# Patient Record
Sex: Male | Born: 1955 | Race: White | Hispanic: No | Marital: Married | Smoking: Never smoker
Health system: Southern US, Community
[De-identification: ages and names within clinical notes are randomized; demographics above are authoritative.]

## PROBLEM LIST (undated history)

## (undated) DIAGNOSIS — C4490 Unspecified malignant neoplasm of skin, unspecified: Secondary | ICD-10-CM

## (undated) DIAGNOSIS — Z7984 Long term (current) use of oral hypoglycemic drugs: Secondary | ICD-10-CM

## (undated) DIAGNOSIS — R945 Abnormal results of liver function studies: Secondary | ICD-10-CM

## (undated) DIAGNOSIS — D473 Essential (hemorrhagic) thrombocythemia: Principal | ICD-10-CM

## (undated) DIAGNOSIS — E119 Type 2 diabetes mellitus without complications: Secondary | ICD-10-CM

## (undated) DIAGNOSIS — M109 Gout, unspecified: Secondary | ICD-10-CM

## (undated) HISTORY — DX: Long term (current) use of oral hypoglycemic drugs: Z79.84

## (undated) HISTORY — DX: Gout, unspecified: M10.9

## (undated) HISTORY — DX: Abnormal results of liver function studies: R94.5

## (undated) HISTORY — DX: Unspecified malignant neoplasm of skin, unspecified: C44.90

## (undated) HISTORY — DX: Type 2 diabetes mellitus without complications: E11.9

## (undated) HISTORY — DX: Essential (hemorrhagic) thrombocythemia: D47.3

---

## 1999-11-27 ENCOUNTER — Ambulatory Visit (HOSPITAL_COMMUNITY): Admission: RE | Admit: 1999-11-27 | Discharge: 1999-11-27 | Payer: Self-pay | Admitting: Family Medicine

## 1999-11-27 ENCOUNTER — Encounter: Payer: Self-pay | Admitting: Family Medicine

## 2009-08-16 ENCOUNTER — Ambulatory Visit (HOSPITAL_BASED_OUTPATIENT_CLINIC_OR_DEPARTMENT_OTHER): Admission: RE | Admit: 2009-08-16 | Discharge: 2009-08-16 | Payer: Self-pay | Admitting: Orthopedic Surgery

## 2011-03-20 LAB — POCT HEMOGLOBIN-HEMACUE: Hemoglobin: 15.6 g/dL (ref 13.0–17.0)

## 2014-12-04 ENCOUNTER — Ambulatory Visit (HOSPITAL_BASED_OUTPATIENT_CLINIC_OR_DEPARTMENT_OTHER): Payer: BC Managed Care – PPO | Admitting: Genetic Counselor

## 2014-12-04 ENCOUNTER — Encounter: Payer: Self-pay | Admitting: Genetic Counselor

## 2014-12-04 ENCOUNTER — Ambulatory Visit: Payer: BC Managed Care – PPO

## 2014-12-04 DIAGNOSIS — Z315 Encounter for genetic counseling: Secondary | ICD-10-CM

## 2014-12-04 DIAGNOSIS — C439 Malignant melanoma of skin, unspecified: Secondary | ICD-10-CM

## 2014-12-04 DIAGNOSIS — Z808 Family history of malignant neoplasm of other organs or systems: Secondary | ICD-10-CM | POA: Insufficient documentation

## 2014-12-04 DIAGNOSIS — Z800 Family history of malignant neoplasm of digestive organs: Secondary | ICD-10-CM

## 2014-12-04 DIAGNOSIS — Z803 Family history of malignant neoplasm of breast: Secondary | ICD-10-CM

## 2014-12-04 NOTE — Progress Notes (Signed)
REFERRING PROVIDER: Crista Luria, MD [address] [place], [place] [address]  PRIMARY PROVIDER:  Tivis Ringer, MD  PRIMARY REASON FOR VISIT:  1. Melanoma of skin   2. Family history of breast cancer   3. Family history of pancreatic cancer   4. Family history of melanoma      HISTORY OF PRESENT ILLNESS:   Victor Nichols, a 58 y.o. male, was seen for a [facility] cancer genetics consultation at the request of Dr. Crista Nichols due to a personal and family history of cancer.  Victor Nichols presents to clinic today to discuss the possibility of a hereditary predisposition to cancer, genetic testing, and to further clarify his future cancer risks, as well as potential cancer risks for family members.   CANCER HISTORY:   No history exists.     HISTORY OF PRESENT ILLNESS: In 1999, at the age of 33, Victor Nichols was diagnosed with malignant melanom of the left zygoma. This was treated with surgical incision.  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  Victor Nichols grew up in [place], and feels that his melanoma is due to sun exposure.    RISK FACTORS:  Smoker: no Alcohol: yes Diabetes: yes Sun Screen use: yes Skin exam: every 3-6 months.  Past Medical History  Diagnosis Date  . Skin cancer 1999/2007/2015    malignant melanoma x2; melanoma in situ x1    History reviewed. No pertinent past surgical history.  History   Social History  . Marital Status: Married    Spouse Name: N/A    Number of Children: 3  . Years of Education: N/A   Social History Main Topics  . Smoking status: Never Smoker   . Smokeless tobacco: None  . Alcohol Use: Yes  . Drug Use: No  . Sexual Activity: None   Other Topics Concern  . None   Social History Narrative  . None     FAMILY HISTORY:  We obtained a detailed, 4-generation family history.  Significant diagnoses are listed below: Family History   Problem Relation Age of Onset  . Melanoma Father 42  . Breast cancer Paternal Aunt 47  . Pancreatic cancer Paternal Grandmother 16  . Cancer Cousin 33    tonsilar cancer   Victor Nichols has two sisters who has not had any form of cancer.  His father was recently diagnosed with melanoma on his ear, and his mother has had multiple BCC/SCC.  Victor Nichols paternal aunt had breast cancer at age 50, her son had tonsilar cancer at 64, and his paternal grandmother had pancreatic cancer.  Patient's maternal and paternal ancestors are of [place] descent. There is reported Ashkenazi Jewish ancestry. There is no known consanguinity.  GENETIC COUNSELING ASSESSMENT: MALE MINISH is a 58 y.o. male with a personal history of melanoma (3 diagnosis) and a family history of breast and pancreatic cancer and melanoma which somewhat suggestive of a CDKN2A/CDK4 mutation, or possibly a BRCA mutation based on the cancer history and Jewish descent and therefore a predisposition to cancer. We, therefore, discussed and recommended the following at today's visit.   DISCUSSION: We reviewed the characteristics, features and inheritance patterns of hereditary cancer syndromes. Approximately 10% of melanoma cases are hereditary.  Most cases of hereditary melanoma, especially in light of a family history of pancreatic cancer, is due to CDKN2A/CDK4 mutations.  There are other hereditary conditions  associated with the cancers in his family as well, most specifically BRCA mutations.  We reviewed BRCA mutations in light of his family history and ancestry.  Other genes that can increase the risk for hereditary melanoma include: BAP1, PTEN, TP53, MITF, RB1 and BRCA2.  There are three genes that have limited evidence to melanoma increased risk which include BRCA1, MC1R, and TERT.   We also discussed genetic testing, including the appropriate family members to test, the process of testing, insurance coverage and turn-around-time for results.  We discussed the implications of a negative, positive and/or variant of uncertain significant result. In order to test for risks based on his Ashkenazi Jewish ancestry as well as his personal and family history, we recommended Victor Nichols pursue genetic testing for the Melanoma gene panel. The [facility] melanoma gene panel consists of the following genes:  BAP1, BRCA2 CDKN2A, CDK4, PTEN, MITF, RB1  And TP53 and the three limited evidence genes including BRCA1, MC1R, and TERT.  PLAN: After considering the risks, benefits, and limitations,Victor Nichols  provided informed consent to pursue genetic testing and the blood sample was sent to [facility] for analysis of the Melanoma gene panel. Results should be available within approximately 3 weeks' time, at which point they will be disclosed by telephone to Victor Nichols, as will any additional recommendations warranted by these results. Victor Nichols will receive a summary of his genetic counseling visit and a copy of his results once available. This information will also be available in Epic. We encouraged Victor Nichols to remain in contact with cancer genetics annually so that we can continuously update the family history and inform him of any changes in cancer genetics and testing that may be of benefit for his family. Victor Nichols questions were answered to his satisfaction today. Our contact information was provided should additional questions or concerns arise.  Lastly, we encouraged Victor Nichols to remain in contact with cancer genetics annually so that we can continuously update the family history and inform him of any changes in cancer genetics and testing that may be of benefit for this family.   Mr.  Nichols questions were answered to his satisfaction today. Our contact information was provided should additional questions or concerns arise. Thank you for the referral and allowing [place] to share in the care of your patient.   Victor Nichols P. Victor Nichols, [place], [facility] Certified Genetic  Counselor [email or web address] phone: [phone]  The patient was seen for a total of 45 minutes in face-to-face genetic counseling.  This patient was discussed with Drs. Magrinat, Lindi Adie and/or Burr Medico who agrees with the above.    _______________________________________________________________________ For Office Staff:  Number of people involved in session: 1 Was an Intern/ student involved with case: no

## 2014-12-25 ENCOUNTER — Encounter: Payer: Self-pay | Admitting: Genetic Counselor

## 2014-12-25 ENCOUNTER — Telehealth: Payer: Self-pay | Admitting: Genetic Counselor

## 2014-12-25 DIAGNOSIS — Z1379 Encounter for other screening for genetic and chromosomal anomalies: Secondary | ICD-10-CM | POA: Insufficient documentation

## 2014-12-25 NOTE — Progress Notes (Signed)
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 Please refer to our prior cancer genetics clinic note for more information regarding Dr. Everlene Other medical, social and family histories, and our assessment and recommendations, at the time. Dr. Everlene Other recent genetic test results were disclosed to him, as were recommendations warranted by these results. These results and recommendations are discussed in more detail below.  GENETIC TEST RESULTS: At the time of Dr. Everlene Other visit, we recommended he pursue genetic testing of the Melanoma gene panel. The melanoma panel performs sequencing and deletion and duplication testing of the following 11 genes:  BAP1, BRA1, BRCA2, CDK4, CDKN2A, MC1R, PTEN, RB1, TERT, and TP53.  Sequencing only was performed on MITF (c.952G>A variant only).  Report date is 12/24/2014. Testing was performed at [facility]. 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 The test report has been scanned into EPIC and is located under the Media tab. Dr. Everlene Other results were released to him via the [facility] patient portal.  Based on Dr. Everlene Other Ashkenazi Jewish heritage, we had discussed the increased risk for testing positive for a founder mutation within Datto or BRCA2.  Both of these genes were tested through sequencing and deletion and duplication analysis and were negative for any deleterious mutations.   We discussed with Dr. Conrad Byram that since the current genetic testing is not perfect, it is possible there may be a gene mutation in one of these genes that current testing cannot detect, but that chance is small. We also discussed, that it is possible that another gene that has not yet been discovered, or that we have not yet tested, is responsible for the cancer diagnoses in  the family, and it is, therefore, important to remain in touch with cancer genetics in the future so that we can continue to offer Dr. Conrad Hiram the most up to date genetic testing.   CANCER SCREENING RECOMMENDATIONS: This result is reassuring and suggests that Dr. Everlene Other cancer was most likely not due to an inherited predisposition associated with one of these genes. Most cancers happen by chance and this negative test, along with details of his family history, suggests that his cancer falls into this category. We, therefore, recommended he continue to follow the cancer management and screening guidelines provided by his oncology and primary providers.   RECOMMENDATIONS FOR FAMILY MEMBERS: Women in this family might be at some increased risk of developing cancer, over the general population risk, simply due to the family history of cancer. We recommended women in this family have a yearly mammogram beginning at age 58, an an annual clinical breast exam, and perform monthly breast self-exams. Women in this family should also have a gynecological exam as recommended by their primary provider. All family members should have a colonoscopy by age 58.  FOLLOW-UP: Lastly, we discussed with Mr. Baney that cancer genetics is a rapidly advancing field and it is possible that new genetic tests will be appropriate for him and/or his family members in the future. We encouraged him to remain in contact with cancer genetics on an annual basis so we can update his personal and family histories and let him know of advances in cancer genetics that may benefit this family.   Our contact number was provided. Mr.. Kopera questions were answered to his satisfaction, and she  knows he is welcome to call us at anytime with additional questions or concerns.   Roma Kayser, MS, [facility] Certified Genetic Counselor [email or web address]

## 2014-12-25 NOTE — Telephone Encounter (Signed)
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  Also discussed that based on his [place] heritage, we were concerned about a possible founder BRCA1 or BRCA2 deleterious mutation.  No deleterious mutations were found in any genes tested.  The melanoma panel performs sequencing and deletion and duplication testing of the following 11 genes:  BAP1, BRA1, BRCA2, CDK4, CDKN2A, MC1R, PTEN, RB1, TERT, and TP53.  Sequencing only was performed on MITF (c.952G>A variant only).  Report date is 12/24/2014.

## 2016-03-20 ENCOUNTER — Telehealth: Payer: Self-pay | Admitting: Genetic Counselor

## 2016-03-20 NOTE — Telephone Encounter (Signed)
Spoke with wife.  She will contact Dr. Conrad Fithian and have him call me.  Left my phone number.

## 2016-03-20 NOTE — Telephone Encounter (Signed)
Revealed that MC1R VUS previously found on Melanoma cancer panel has been reclassified as a likely benign change. The test is now considered completely negative. Patient voiced his understanding.

## 2016-06-08 DIAGNOSIS — L720 Epidermal cyst: Secondary | ICD-10-CM | POA: Diagnosis not present

## 2016-06-08 DIAGNOSIS — L821 Other seborrheic keratosis: Secondary | ICD-10-CM | POA: Diagnosis not present

## 2016-06-08 DIAGNOSIS — Z85828 Personal history of other malignant neoplasm of skin: Secondary | ICD-10-CM | POA: Diagnosis not present

## 2016-06-08 DIAGNOSIS — Z85820 Personal history of malignant melanoma of skin: Secondary | ICD-10-CM | POA: Diagnosis not present

## 2016-08-05 DIAGNOSIS — D225 Melanocytic nevi of trunk: Secondary | ICD-10-CM | POA: Diagnosis not present

## 2016-08-05 DIAGNOSIS — Z85828 Personal history of other malignant neoplasm of skin: Secondary | ICD-10-CM | POA: Diagnosis not present

## 2016-08-05 DIAGNOSIS — Z85820 Personal history of malignant melanoma of skin: Secondary | ICD-10-CM | POA: Diagnosis not present

## 2016-08-05 DIAGNOSIS — Z87898 Personal history of other specified conditions: Secondary | ICD-10-CM | POA: Diagnosis not present

## 2016-09-21 DIAGNOSIS — Z0000 Encounter for general adult medical examination without abnormal findings: Secondary | ICD-10-CM | POA: Diagnosis not present

## 2016-09-21 DIAGNOSIS — E119 Type 2 diabetes mellitus without complications: Secondary | ICD-10-CM | POA: Diagnosis not present

## 2016-09-21 DIAGNOSIS — M109 Gout, unspecified: Secondary | ICD-10-CM | POA: Diagnosis not present

## 2016-09-21 DIAGNOSIS — Z125 Encounter for screening for malignant neoplasm of prostate: Secondary | ICD-10-CM | POA: Diagnosis not present

## 2016-09-29 DIAGNOSIS — E119 Type 2 diabetes mellitus without complications: Secondary | ICD-10-CM | POA: Diagnosis not present

## 2016-09-29 DIAGNOSIS — Z23 Encounter for immunization: Secondary | ICD-10-CM | POA: Diagnosis not present

## 2016-09-29 DIAGNOSIS — M109 Gout, unspecified: Secondary | ICD-10-CM | POA: Diagnosis not present

## 2016-09-29 DIAGNOSIS — C439 Malignant melanoma of skin, unspecified: Secondary | ICD-10-CM | POA: Diagnosis not present

## 2016-09-29 DIAGNOSIS — D72829 Elevated white blood cell count, unspecified: Secondary | ICD-10-CM | POA: Diagnosis not present

## 2016-09-29 DIAGNOSIS — Z1389 Encounter for screening for other disorder: Secondary | ICD-10-CM | POA: Diagnosis not present

## 2017-04-14 ENCOUNTER — Ambulatory Visit: Payer: Self-pay

## 2017-05-11 ENCOUNTER — Encounter: Payer: Self-pay | Admitting: Oncology

## 2017-05-11 ENCOUNTER — Ambulatory Visit (INDEPENDENT_AMBULATORY_CARE_PROVIDER_SITE_OTHER): Payer: 59 | Admitting: Oncology

## 2017-05-11 VITALS — BP 135/92 | HR 76 | Temp 97.6°F | Ht 72.0 in | Wt 220.7 lb

## 2017-05-11 DIAGNOSIS — Z800 Family history of malignant neoplasm of digestive organs: Secondary | ICD-10-CM

## 2017-05-11 DIAGNOSIS — Z832 Family history of diseases of the blood and blood-forming organs and certain disorders involving the immune mechanism: Secondary | ICD-10-CM | POA: Diagnosis not present

## 2017-05-11 DIAGNOSIS — Z888 Allergy status to other drugs, medicaments and biological substances status: Secondary | ICD-10-CM

## 2017-05-11 DIAGNOSIS — D473 Essential (hemorrhagic) thrombocythemia: Secondary | ICD-10-CM

## 2017-05-11 DIAGNOSIS — Z803 Family history of malignant neoplasm of breast: Secondary | ICD-10-CM

## 2017-05-11 DIAGNOSIS — Z7982 Long term (current) use of aspirin: Secondary | ICD-10-CM

## 2017-05-11 DIAGNOSIS — R7989 Other specified abnormal findings of blood chemistry: Secondary | ICD-10-CM

## 2017-05-11 DIAGNOSIS — Z7984 Long term (current) use of oral hypoglycemic drugs: Secondary | ICD-10-CM

## 2017-05-11 DIAGNOSIS — Z808 Family history of malignant neoplasm of other organs or systems: Secondary | ICD-10-CM

## 2017-05-11 DIAGNOSIS — Z8269 Family history of other diseases of the musculoskeletal system and connective tissue: Secondary | ICD-10-CM | POA: Diagnosis not present

## 2017-05-11 DIAGNOSIS — R945 Abnormal results of liver function studies: Secondary | ICD-10-CM

## 2017-05-11 DIAGNOSIS — E119 Type 2 diabetes mellitus without complications: Secondary | ICD-10-CM

## 2017-05-11 DIAGNOSIS — M109 Gout, unspecified: Secondary | ICD-10-CM

## 2017-05-11 DIAGNOSIS — R7303 Prediabetes: Secondary | ICD-10-CM | POA: Diagnosis not present

## 2017-05-11 HISTORY — DX: Gout, unspecified: M10.9

## 2017-05-11 HISTORY — DX: Type 2 diabetes mellitus without complications: E11.9

## 2017-05-11 HISTORY — DX: Other specified abnormal findings of blood chemistry: R79.89

## 2017-05-11 HISTORY — DX: Essential (hemorrhagic) thrombocythemia: D47.3

## 2017-05-11 HISTORY — DX: Abnormal results of liver function studies: R94.5

## 2017-05-11 LAB — SAVE SMEAR

## 2017-05-11 MED ORDER — HYDROXYUREA 500 MG PO CAPS: 500.0000 mg | ORAL_CAPSULE | Freq: Every day | ORAL | 6 refills | Status: DC

## 2017-05-11 NOTE — Patient Instructions (Addendum)
Start Hydrea 500 mg one daily  Lab every 2 weeks to start Friday 6/15 Return visit 8-10 weeks We will schedule an ultrasound of your abdomen & someone will call with an appointment

## 2017-05-11 NOTE — Progress Notes (Signed)
New Patient Hematology   NEWMAN WAREN [id number] Mar 17, 1956 61 y.o. 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  This was initially felt to be secondary to an acute gout flare.  However, once gout was under control platelet count remained elevated.  Count in the range of 800,000.  I asked his primary care physician to obtain a JAK-2 gene analysis and in fact he did have a mutation in this gene.  He had no neurologic signs or symptoms.  No prior history of thrombosis.  I recommended aspirin 81 mg daily.  We had a number of phone discussions informally.  He is here today for a more formal evaluation as he turns 61 years old next month. He has no major risk factors for vascular disease.  He is normotensive.  No hyperlipidemia.  Non-smoker.  He was found to be prediabetic about 4 years ago and is currently on metformin. Of interest is the fact that his mother who is in her 67s has been diagnosed with polycythemia vera. JAK-2 status unknown.  Currently on Hydrea.  She has also developed temporal arteritis.  A 63 year old sister has an elevated platelet count.  Further evaluation not known to the patient.  Another sister age 66 with no known blood problems.   PMH: Past Medical History:  Diagnosis Date  . Abnormal liver function test 05/11/2017   Mild, chronic, transaminase elevations; liver edge palpable on exam  . 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 9/17  Platelets 800,000  . Gouty arthritis 05/11/2017  . 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 No history of ulcers, hypertension, hepatitis, yellow jaundice, malaria, asthma, tuberculosis, inflammatory arthritis other than gout.  No  history of seizure, stroke, thyroid disease, prostate trouble.   surgical history: Negative except excision of melanoma and basal cell carcinomas  Allergies: Allergies  Allergen Reactions  . Allopurinol Rash    Medications:  Current Outpatient Prescriptions:  .  hydroxyurea (HYDREA) 500 MG capsule, Take 1 capsule (500 mg total) by mouth daily. May take with food to minimize GI side effects., Disp: 30 capsule, Rfl: 6 to start on May 17, 2017. Uloric 40 mg daily Glucophage 500 mg daily?  Social History: Anesthesiologist.  Married.  Originally from [place].  Son age 13, son age 64, daughter age 77, all healthy.  reports that he has never smoked. He has never used smokeless tobacco. He reports that he drinks alcohol. He reports that he does not use drugs.  Family History: Family History  Problem Relation Age of Onset  . Melanoma Father 34  . Breast cancer Paternal Aunt 31  . Pancreatic cancer Paternal Grandmother 49  . Cancer Cousin 70       tonsilar cancer  See HPI  Review of Systems: No headache, double vision, blurry vision, slurred speech, focal weakness, or paresthesias. No chest pain, dyspnea, chest pressure, palpitations. No abdominal pain, no change in bowel habit, chronic, mild, elevation of transaminase enzymes felt possibly secondary to chronic exposure to anesthetics. No urinary tract symptoms Remaining ROS negative.  Physical Exam: Blood pressure (!) 135/92, pulse 76, temperature 97.6 F (36.4 C), temperature source Oral, height 6' (1.829 m), weight 220 lb 11.2 oz (100.1  kg), SpO2 100 %. Wt Readings from Last 3 Encounters:  05/11/17 220 lb 11.2 oz (100.1 kg)     General appearance: Well-nourished Caucasian man HENNT: Pharynx no erythema, exudate, mass, or ulcer. No thyromegaly or thyroid nodules Lymph nodes: No cervical, supraclavicular, or axillary lymphadenopathy Breasts:  Lungs: Clear to auscultation, resonant to percussion throughout Heart: Regular  rhythm, no murmur, no gallop, no rub, no click, no edema Abdomen: Soft, nontender, normal bowel sounds, no mass, liver palpable 3-4 cm below right costal margin.  Smooth edge.  No definite splenomegaly. Extremities: No edema, no calf tenderness Musculoskeletal: no joint deformities GU:  Vascular: Carotid pulses 2+, no bruits, distal pulses: Dorsalis pedis 2+ symmetric Neurologic: Alert, oriented, PERRLA, optic discs sharp and vessels normal, no hemorrhage or exudate, cranial nerves grossly normal, motor strength 5 over 5, reflexes 1+ symmetric, upper body coordination normal, gait normal, Skin: No rash or ecchymosis    Lab Results: Lab Results  Component Value Date   HGB 15.6 08/16/2009     Chemistry   No results found for: NA, K, CL, CO2, BUN, CREATININE, GLU No results found for: CALCIUM, ALKPHOS, AST, ALT, BILITOT     Review of peripheral blood film:   Impression: Essential thrombocythemia  We had a open discussion about diagnosis, prognosis, and available treatment options.  We reviewed the fact that ET is a stem cell disorder.  It portends an increased risk for both venous and arterial thrombosis.  Prophylactic aspirin has been the mainstay to prevent thrombosis in individuals diagnosed with non-CML myeloproliferative disorders.  No one has ever done a high quality prospective study relating absolute platelet number with thrombosis.  Thrombosis is most likely related to platelet dysfunction and not absolute number and this can occur at any number.  Traditional risk factors for thrombosis have been history of any prior thrombosis, male sex, and up until recently, age 5 or above.  A recent large cohort study looking at risk for arterial and venous thrombosis in patients with myeloproliferative neoplasms published in the annals of internal medicine February 16, 2017, 168:317 by Kristie Cowman al, found that thrombotic risk was independent of age.  Many of the thrombotic events tend to occur  as sign of initial disease or within 1 month of diagnosis. It also appears that the JAK-2 mutation carries a higher risk of thrombosis than the other major mutations calreticulin and mpl.  It is suggested that a more comprehensive risk assessment including these observations and genetic data will better inform treatment decisions better than traditional parameters of age and sex.  This study did not address risk related to absolute platelet numbers but did cite a number of references that have demonstrated that the absolute platelet count has not been well correlated with increased thrombotic risk.     Recommendation: He is already on aspirin.  No prior thrombotic events.  He will turn 61 next month.  He was found to have a JAK-2 mutation which may predict a higher risk for thrombosis. Some international experts including Dr.Tifferi at the [facility], would recommend starting him on platelet lowering therapy.  His mother is already on Hydrea and he is encouraged that she has tolerated this very well.  He is not opposed to a trial of Hydrea and we decided to start him on 500 mg daily.  Monitor blood counts every 2 weeks until steady state with target platelet count of 400,000 or below realizing that this is totally arbitrary.    Murriel Hopper, MD,  FACP  Hematology-Oncology/Internal Medicine  05/11/2017, 5:11 PM

## 2017-05-12 LAB — CBC WITH DIFFERENTIAL/PLATELET
Basophils Absolute: 0 10*3/uL (ref 0.0–0.2)
Basos: 0 %
EOS (ABSOLUTE): 0.3 10*3/uL (ref 0.0–0.4)
Eos: 2 %
Hematocrit: 40.7 % (ref 37.5–51.0)
Hemoglobin: 13.7 g/dL (ref 13.0–17.7)
IMMATURE GRANS (ABS): 0 10*3/uL (ref 0.0–0.1)
Immature Granulocytes: 0 %
LYMPHS ABS: 2.9 10*3/uL (ref 0.7–3.1)
LYMPHS: 21 %
MCH: 29.5 pg (ref 26.6–33.0)
MCHC: 33.7 g/dL (ref 31.5–35.7)
MCV: 88 fL (ref 79–97)
Monocytes Absolute: 1.1 10*3/uL — ABNORMAL HIGH (ref 0.1–0.9)
Monocytes: 8 %
NEUTROS ABS: 9.2 10*3/uL — AB (ref 1.4–7.0)
Neutrophils: 69 %
PLATELETS: 826 10*3/uL — AB (ref 150–379)
RBC: 4.65 x10E6/uL (ref 4.14–5.80)
RDW: 15.4 % (ref 12.3–15.4)
WBC: 13.6 10*3/uL — ABNORMAL HIGH (ref 3.4–10.8)

## 2017-05-13 ENCOUNTER — Ambulatory Visit (HOSPITAL_COMMUNITY)
Admission: RE | Admit: 2017-05-13 | Discharge: 2017-05-13 | Disposition: A | Discharge status: Home / Self Care | Payer: 59 | Source: Ambulatory Visit | Attending: Oncology | Admitting: Oncology

## 2017-05-13 DIAGNOSIS — K760 Fatty (change of) liver, not elsewhere classified: Secondary | ICD-10-CM | POA: Insufficient documentation

## 2017-05-13 DIAGNOSIS — N200 Calculus of kidney: Secondary | ICD-10-CM | POA: Diagnosis not present

## 2017-05-13 DIAGNOSIS — D696 Thrombocytopenia, unspecified: Secondary | ICD-10-CM | POA: Insufficient documentation

## 2017-05-13 DIAGNOSIS — R7989 Other specified abnormal findings of blood chemistry: Secondary | ICD-10-CM

## 2017-05-13 DIAGNOSIS — R945 Abnormal results of liver function studies: Secondary | ICD-10-CM | POA: Diagnosis present

## 2017-05-13 DIAGNOSIS — D473 Essential (hemorrhagic) thrombocythemia: Secondary | ICD-10-CM

## 2017-05-26 ENCOUNTER — Ambulatory Visit (HOSPITAL_COMMUNITY): Payer: 59

## 2017-05-28 ENCOUNTER — Other Ambulatory Visit: Payer: Self-pay | Admitting: Oncology

## 2017-05-28 ENCOUNTER — Other Ambulatory Visit (INDEPENDENT_AMBULATORY_CARE_PROVIDER_SITE_OTHER): Payer: 59

## 2017-05-28 DIAGNOSIS — M109 Gout, unspecified: Secondary | ICD-10-CM

## 2017-05-28 DIAGNOSIS — D473 Essential (hemorrhagic) thrombocythemia: Secondary | ICD-10-CM

## 2017-05-28 LAB — COMPREHENSIVE METABOLIC PANEL
ALBUMIN: 4.1 g/dL (ref 3.5–5.0)
ALK PHOS: 75 U/L (ref 38–126)
ALT: 44 U/L (ref 17–63)
ANION GAP: 8 (ref 5–15)
AST: 32 U/L (ref 15–41)
BUN: 18 mg/dL (ref 6–20)
CALCIUM: 9.7 mg/dL (ref 8.9–10.3)
CHLORIDE: 102 mmol/L (ref 101–111)
CO2: 27 mmol/L (ref 22–32)
Creatinine, Ser: 1.19 mg/dL (ref 0.61–1.24)
GFR calc Af Amer: >60 mL/min (ref >60)
GFR calc non Af Amer: >60 mL/min (ref >60)
GLUCOSE: 173 mg/dL — AB (ref 65–99)
POTASSIUM: 4.5 mmol/L (ref 3.5–5.1)
SODIUM: 137 mmol/L (ref 135–145)
Total Bilirubin: 0.5 mg/dL (ref 0.3–1.2)
Total Protein: 7.6 g/dL (ref 6.5–8.1)

## 2017-05-28 LAB — CBC WITH DIFFERENTIAL/PLATELET
BASOS PCT: 0 %
Basophils Absolute: 0 10*3/uL (ref 0.0–0.1)
EOS ABS: 0.2 10*3/uL (ref 0.0–0.7)
EOS PCT: 1 %
HCT: 44.1 % (ref 39.0–52.0)
HEMOGLOBIN: 14.5 g/dL (ref 13.0–17.0)
Lymphocytes Relative: 19 %
Lymphs Abs: 2.3 10*3/uL (ref 0.7–4.0)
MCH: 29.6 pg (ref 26.0–34.0)
MCHC: 32.9 g/dL (ref 30.0–36.0)
MCV: 90 fL (ref 78.0–100.0)
MONOS PCT: 6 %
Monocytes Absolute: 0.7 10*3/uL (ref 0.1–1.0)
NEUTROS PCT: 74 %
Neutro Abs: 8.8 10*3/uL — ABNORMAL HIGH (ref 1.7–7.7)
PLATELETS: 774 10*3/uL — AB (ref 150–400)
RBC: 4.9 MIL/uL (ref 4.22–5.81)
RDW: 15.5 % (ref 11.5–15.5)
WBC: 12 10*3/uL — AB (ref 4.0–10.5)

## 2017-05-28 LAB — URIC ACID: Uric Acid, Serum: 4.6 mg/dL (ref 4.4–7.6)

## 2017-05-28 LAB — LACTATE DEHYDROGENASE: LDH: 182 U/L (ref 98–192)

## 2017-05-28 MED ORDER — HYDROXYUREA 500 MG PO CAPS: ORAL_CAPSULE | ORAL | 6 refills | Status: DC

## 2017-05-28 NOTE — Addendum Note (Signed)
Addended by: Truddie Crumble on: 05/28/2017 10:18 AM   Modules accepted: Orders

## 2017-06-09 ENCOUNTER — Other Ambulatory Visit (INDEPENDENT_AMBULATORY_CARE_PROVIDER_SITE_OTHER): Payer: 59

## 2017-06-09 ENCOUNTER — Other Ambulatory Visit: Payer: Self-pay | Admitting: Oncology

## 2017-06-09 DIAGNOSIS — D473 Essential (hemorrhagic) thrombocythemia: Secondary | ICD-10-CM | POA: Diagnosis not present

## 2017-06-09 LAB — CBC WITH DIFFERENTIAL/PLATELET
BASOS ABS: 0 10*3/uL (ref 0.0–0.1)
BASOS PCT: 0 %
EOS ABS: 0.1 10*3/uL (ref 0.0–0.7)
Eosinophils Relative: 1 %
HCT: 41.5 % (ref 39.0–52.0)
HEMOGLOBIN: 13.9 g/dL (ref 13.0–17.0)
Lymphocytes Relative: 18 %
Lymphs Abs: 2.2 10*3/uL (ref 0.7–4.0)
MCH: 29.9 pg (ref 26.0–34.0)
MCHC: 33.5 g/dL (ref 30.0–36.0)
MCV: 89.2 fL (ref 78.0–100.0)
MONOS PCT: 8 %
Monocytes Absolute: 1 10*3/uL (ref 0.1–1.0)
NEUTROS PCT: 73 %
Neutro Abs: 9 10*3/uL — ABNORMAL HIGH (ref 1.7–7.7)
Platelets: 616 10*3/uL — ABNORMAL HIGH (ref 150–400)
RBC: 4.65 MIL/uL (ref 4.22–5.81)
RDW: 15.9 % — AB (ref 11.5–15.5)
WBC: 12.4 10*3/uL — ABNORMAL HIGH (ref 4.0–10.5)

## 2017-06-09 NOTE — Addendum Note (Signed)
Addended by: Truddie Crumble on: 06/09/2017 10:57 AM   Modules accepted: Orders

## 2017-06-09 NOTE — Addendum Note (Signed)
Addended by: Truddie Crumble on: 06/09/2017 10:40 AM   Modules accepted: Orders

## 2017-06-29 ENCOUNTER — Other Ambulatory Visit (INDEPENDENT_AMBULATORY_CARE_PROVIDER_SITE_OTHER): Payer: 59

## 2017-06-29 ENCOUNTER — Other Ambulatory Visit: Payer: Self-pay | Admitting: Oncology

## 2017-06-29 DIAGNOSIS — D473 Essential (hemorrhagic) thrombocythemia: Secondary | ICD-10-CM

## 2017-06-29 LAB — CBC WITH DIFFERENTIAL/PLATELET
BASOS ABS: 0 10*3/uL (ref 0.0–0.1)
BASOS PCT: 0 %
EOS PCT: 1 %
Eosinophils Absolute: 0.1 10*3/uL (ref 0.0–0.7)
HCT: 41.9 % (ref 39.0–52.0)
Hemoglobin: 14.4 g/dL (ref 13.0–17.0)
LYMPHS PCT: 22 %
Lymphs Abs: 2.8 10*3/uL (ref 0.7–4.0)
MCH: 30.6 pg (ref 26.0–34.0)
MCHC: 34.4 g/dL (ref 30.0–36.0)
MCV: 89.1 fL (ref 78.0–100.0)
MONO ABS: 1.1 10*3/uL — AB (ref 0.1–1.0)
MONOS PCT: 9 %
Neutro Abs: 8.8 10*3/uL — ABNORMAL HIGH (ref 1.7–7.7)
Neutrophils Relative %: 68 %
PLATELETS: 541 10*3/uL — AB (ref 150–400)
RBC: 4.7 MIL/uL (ref 4.22–5.81)
RDW: 16.6 % — AB (ref 11.5–15.5)
WBC: 12.8 10*3/uL — ABNORMAL HIGH (ref 4.0–10.5)

## 2017-08-12 ENCOUNTER — Other Ambulatory Visit (INDEPENDENT_AMBULATORY_CARE_PROVIDER_SITE_OTHER): Payer: 59

## 2017-08-12 DIAGNOSIS — D473 Essential (hemorrhagic) thrombocythemia: Secondary | ICD-10-CM | POA: Diagnosis not present

## 2017-08-12 LAB — CBC WITH DIFFERENTIAL/PLATELET
Basophils Absolute: 0 10*3/uL (ref 0.0–0.1)
Basophils Relative: 0 %
EOS PCT: 2 %
Eosinophils Absolute: 0.2 10*3/uL (ref 0.0–0.7)
HCT: 40.8 % (ref 39.0–52.0)
Hemoglobin: 13.9 g/dL (ref 13.0–17.0)
LYMPHS ABS: 3 10*3/uL (ref 0.7–4.0)
LYMPHS PCT: 27 %
MCH: 31.6 pg (ref 26.0–34.0)
MCHC: 34.1 g/dL (ref 30.0–36.0)
MCV: 92.7 fL (ref 78.0–100.0)
MONO ABS: 1.1 10*3/uL — AB (ref 0.1–1.0)
Monocytes Relative: 10 %
Neutro Abs: 6.8 10*3/uL (ref 1.7–7.7)
Neutrophils Relative %: 61 %
PLATELETS: 396 10*3/uL (ref 150–400)
RBC: 4.4 MIL/uL (ref 4.22–5.81)
RDW: 15.9 % — AB (ref 11.5–15.5)
WBC: 11 10*3/uL — ABNORMAL HIGH (ref 4.0–10.5)

## 2017-11-12 ENCOUNTER — Other Ambulatory Visit: Payer: Self-pay | Admitting: *Deleted

## 2017-11-12 MED ORDER — HYDROXYUREA 500 MG PO CAPS: ORAL_CAPSULE | ORAL | 6 refills | Status: DC

## 2017-11-12 NOTE — Telephone Encounter (Signed)
Hydroxyurea phoned in to [facility]

## 2017-12-01 DIAGNOSIS — Z85828 Personal history of other malignant neoplasm of skin: Secondary | ICD-10-CM | POA: Diagnosis not present

## 2017-12-01 DIAGNOSIS — Z808 Family history of malignant neoplasm of other organs or systems: Secondary | ICD-10-CM | POA: Diagnosis not present

## 2017-12-01 DIAGNOSIS — D485 Neoplasm of uncertain behavior of skin: Secondary | ICD-10-CM | POA: Diagnosis not present

## 2017-12-01 DIAGNOSIS — Z86018 Personal history of other benign neoplasm: Secondary | ICD-10-CM | POA: Diagnosis not present

## 2017-12-01 DIAGNOSIS — D225 Melanocytic nevi of trunk: Secondary | ICD-10-CM | POA: Diagnosis not present

## 2017-12-01 DIAGNOSIS — L821 Other seborrheic keratosis: Secondary | ICD-10-CM | POA: Diagnosis not present

## 2017-12-22 DIAGNOSIS — D225 Melanocytic nevi of trunk: Secondary | ICD-10-CM | POA: Diagnosis not present

## 2017-12-22 DIAGNOSIS — D485 Neoplasm of uncertain behavior of skin: Secondary | ICD-10-CM | POA: Diagnosis not present

## 2018-01-04 ENCOUNTER — Telehealth: Payer: Self-pay | Admitting: *Deleted

## 2018-01-04 NOTE — Telephone Encounter (Signed)
Call from [place] - needs to transfer Hydrea rx from [facility] pharmacy. Verbal order given to [facility], pharmacist, for Hydrea 500 mg cap -  Alternate 500 mg daily with 1000 mg by mouth daily. May take together at same time on days you take 2 capsules. May take with food to minimize GI side effects qty 45 x 4 refills (originally refilled on 10/2017).

## 2018-01-07 ENCOUNTER — Other Ambulatory Visit (INDEPENDENT_AMBULATORY_CARE_PROVIDER_SITE_OTHER): Payer: BLUE CROSS/BLUE SHIELD

## 2018-01-07 DIAGNOSIS — D473 Essential (hemorrhagic) thrombocythemia: Secondary | ICD-10-CM | POA: Diagnosis not present

## 2018-01-07 LAB — CBC WITH DIFFERENTIAL/PLATELET
BASOS PCT: 0 %
Basophils Absolute: 0 10*3/uL (ref 0.0–0.1)
EOS ABS: 0.7 10*3/uL (ref 0.0–0.7)
Eosinophils Relative: 7 %
HEMATOCRIT: 43.9 % (ref 39.0–52.0)
HEMOGLOBIN: 14.8 g/dL (ref 13.0–17.0)
Lymphocytes Relative: 27 %
Lymphs Abs: 2.8 10*3/uL (ref 0.7–4.0)
MCH: 33 pg (ref 26.0–34.0)
MCHC: 33.7 g/dL (ref 30.0–36.0)
MCV: 98 fL (ref 78.0–100.0)
Monocytes Absolute: 0.7 10*3/uL (ref 0.1–1.0)
Monocytes Relative: 7 %
NEUTROS ABS: 5.9 10*3/uL (ref 1.7–7.7)
NEUTROS PCT: 59 %
Platelets: 307 10*3/uL (ref 150–400)
RBC: 4.48 MIL/uL (ref 4.22–5.81)
RDW: 13.5 % (ref 11.5–15.5)
WBC: 10.1 10*3/uL (ref 4.0–10.5)

## 2018-01-26 DIAGNOSIS — D72829 Elevated white blood cell count, unspecified: Secondary | ICD-10-CM | POA: Diagnosis not present

## 2018-01-26 DIAGNOSIS — K760 Fatty (change of) liver, not elsewhere classified: Secondary | ICD-10-CM | POA: Diagnosis not present

## 2018-01-26 DIAGNOSIS — D473 Essential (hemorrhagic) thrombocythemia: Secondary | ICD-10-CM | POA: Diagnosis not present

## 2018-01-26 DIAGNOSIS — E1165 Type 2 diabetes mellitus with hyperglycemia: Secondary | ICD-10-CM | POA: Diagnosis not present

## 2018-01-26 DIAGNOSIS — Z1389 Encounter for screening for other disorder: Secondary | ICD-10-CM | POA: Diagnosis not present

## 2018-02-21 DIAGNOSIS — H52203 Unspecified astigmatism, bilateral: Secondary | ICD-10-CM | POA: Diagnosis not present

## 2018-02-21 DIAGNOSIS — H524 Presbyopia: Secondary | ICD-10-CM | POA: Diagnosis not present

## 2018-02-21 DIAGNOSIS — E119 Type 2 diabetes mellitus without complications: Secondary | ICD-10-CM | POA: Diagnosis not present

## 2018-02-21 DIAGNOSIS — H5213 Myopia, bilateral: Secondary | ICD-10-CM | POA: Diagnosis not present

## 2018-05-26 ENCOUNTER — Other Ambulatory Visit: Payer: Self-pay | Admitting: Oncology

## 2018-05-26 ENCOUNTER — Telehealth: Payer: Self-pay | Admitting: Internal Medicine

## 2018-05-26 MED ORDER — HYDROXYUREA 500 MG PO CAPS: ORAL_CAPSULE | ORAL | 6 refills | Status: DC

## 2018-05-26 NOTE — Telephone Encounter (Signed)
Called to pharmacy requested spoke w/ [place]

## 2018-05-26 NOTE — Telephone Encounter (Signed)
Patient is requesting refill on hydroxyurea 500mg , pls call pharmacy [phone]

## 2018-06-23 ENCOUNTER — Other Ambulatory Visit (INDEPENDENT_AMBULATORY_CARE_PROVIDER_SITE_OTHER): Payer: BLUE CROSS/BLUE SHIELD

## 2018-06-23 ENCOUNTER — Other Ambulatory Visit: Payer: Self-pay | Admitting: Oncology

## 2018-06-23 DIAGNOSIS — D473 Essential (hemorrhagic) thrombocythemia: Secondary | ICD-10-CM

## 2018-06-23 LAB — CBC WITH DIFFERENTIAL/PLATELET
ABS IMMATURE GRANULOCYTES: 0.1 10*3/uL (ref 0.0–0.1)
BASOS PCT: 1 %
Basophils Absolute: 0.1 10*3/uL (ref 0.0–0.1)
Eosinophils Absolute: 0.1 10*3/uL (ref 0.0–0.7)
Eosinophils Relative: 1 %
HEMATOCRIT: 44.9 % (ref 39.0–52.0)
HEMOGLOBIN: 15.3 g/dL (ref 13.0–17.0)
IMMATURE GRANULOCYTES: 1 %
LYMPHS ABS: 2.6 10*3/uL (ref 0.7–4.0)
LYMPHS PCT: 27 %
MCH: 33.1 pg (ref 26.0–34.0)
MCHC: 34.1 g/dL (ref 30.0–36.0)
MCV: 97.2 fL (ref 78.0–100.0)
MONOS PCT: 9 %
Monocytes Absolute: 0.9 10*3/uL (ref 0.1–1.0)
NEUTROS ABS: 5.9 10*3/uL (ref 1.7–7.7)
Neutrophils Relative %: 61 %
PLATELETS: 342 10*3/uL (ref 150–400)
RBC: 4.62 MIL/uL (ref 4.22–5.81)
RDW: 12.7 % (ref 11.5–15.5)
WBC: 9.5 10*3/uL (ref 4.0–10.5)

## 2018-06-23 NOTE — Progress Notes (Signed)
cbc

## 2018-06-24 DIAGNOSIS — D473 Essential (hemorrhagic) thrombocythemia: Secondary | ICD-10-CM | POA: Diagnosis not present

## 2018-06-24 DIAGNOSIS — D72829 Elevated white blood cell count, unspecified: Secondary | ICD-10-CM | POA: Diagnosis not present

## 2018-06-24 DIAGNOSIS — E1165 Type 2 diabetes mellitus with hyperglycemia: Secondary | ICD-10-CM | POA: Diagnosis not present

## 2018-06-27 DIAGNOSIS — Z85820 Personal history of malignant melanoma of skin: Secondary | ICD-10-CM | POA: Diagnosis not present

## 2018-06-27 DIAGNOSIS — Z85828 Personal history of other malignant neoplasm of skin: Secondary | ICD-10-CM | POA: Diagnosis not present

## 2018-06-27 DIAGNOSIS — L821 Other seborrheic keratosis: Secondary | ICD-10-CM | POA: Diagnosis not present

## 2018-06-27 DIAGNOSIS — D1801 Hemangioma of skin and subcutaneous tissue: Secondary | ICD-10-CM | POA: Diagnosis not present

## 2018-09-06 ENCOUNTER — Other Ambulatory Visit: Payer: Self-pay | Admitting: Oncology

## 2018-09-06 DIAGNOSIS — D473 Essential (hemorrhagic) thrombocythemia: Secondary | ICD-10-CM

## 2018-09-23 ENCOUNTER — Other Ambulatory Visit: Payer: Self-pay | Admitting: Oncology

## 2018-09-23 DIAGNOSIS — D473 Essential (hemorrhagic) thrombocythemia: Secondary | ICD-10-CM

## 2018-11-15 DIAGNOSIS — M109 Gout, unspecified: Secondary | ICD-10-CM | POA: Diagnosis not present

## 2018-11-15 DIAGNOSIS — E119 Type 2 diabetes mellitus without complications: Secondary | ICD-10-CM | POA: Diagnosis not present

## 2018-11-15 DIAGNOSIS — Z0000 Encounter for general adult medical examination without abnormal findings: Secondary | ICD-10-CM | POA: Diagnosis not present

## 2018-11-15 DIAGNOSIS — Z125 Encounter for screening for malignant neoplasm of prostate: Secondary | ICD-10-CM | POA: Diagnosis not present

## 2018-11-15 DIAGNOSIS — E1165 Type 2 diabetes mellitus with hyperglycemia: Secondary | ICD-10-CM | POA: Diagnosis not present

## 2018-11-17 DIAGNOSIS — E119 Type 2 diabetes mellitus without complications: Secondary | ICD-10-CM | POA: Diagnosis not present

## 2018-11-18 DIAGNOSIS — D72829 Elevated white blood cell count, unspecified: Secondary | ICD-10-CM | POA: Diagnosis not present

## 2018-11-18 DIAGNOSIS — Z1331 Encounter for screening for depression: Secondary | ICD-10-CM | POA: Diagnosis not present

## 2018-11-18 DIAGNOSIS — E119 Type 2 diabetes mellitus without complications: Secondary | ICD-10-CM | POA: Diagnosis not present

## 2018-11-18 DIAGNOSIS — K760 Fatty (change of) liver, not elsewhere classified: Secondary | ICD-10-CM | POA: Diagnosis not present

## 2018-11-18 DIAGNOSIS — Z0000 Encounter for general adult medical examination without abnormal findings: Secondary | ICD-10-CM | POA: Diagnosis not present

## 2018-11-18 DIAGNOSIS — D473 Essential (hemorrhagic) thrombocythemia: Secondary | ICD-10-CM | POA: Diagnosis not present

## 2018-11-22 DIAGNOSIS — Z1212 Encounter for screening for malignant neoplasm of rectum: Secondary | ICD-10-CM | POA: Diagnosis not present

## 2018-12-19 DIAGNOSIS — E119 Type 2 diabetes mellitus without complications: Secondary | ICD-10-CM | POA: Diagnosis not present

## 2018-12-20 DIAGNOSIS — E119 Type 2 diabetes mellitus without complications: Secondary | ICD-10-CM | POA: Diagnosis not present

## 2018-12-22 ENCOUNTER — Other Ambulatory Visit: Payer: Self-pay

## 2018-12-22 NOTE — Telephone Encounter (Signed)
Victor Nichols - there is no message attached. [name] was supposed to get him on my schedule for an appt.

## 2018-12-23 MED ORDER — HYDROXYUREA 500 MG PO CAPS: ORAL_CAPSULE | ORAL | 6 refills | Status: DC

## 2018-12-27 ENCOUNTER — Other Ambulatory Visit: Payer: Self-pay | Admitting: *Deleted

## 2018-12-27 MED ORDER — HYDROXYUREA 500 MG PO CAPS: ORAL_CAPSULE | ORAL | 6 refills | Status: AC

## 2018-12-27 NOTE — Telephone Encounter (Signed)
Last refilled 12/23/18 - "Phone In". Can it be sent electronially?

## 2018-12-29 NOTE — Telephone Encounter (Signed)
Late Entry: rx phoned in 12/28/18-no further action needed, phone call complete.Despina Hidden Cassady1/16/20201:17 PM

## 2018-12-29 NOTE — Telephone Encounter (Signed)
Victor Nichols stated she will call rx to the pharmacy.

## 2019-01-03 IMAGING — US US ABDOMEN COMPLETE
1 series · 13 of 25 positions shown · non-contrast
Comparison: None.

CLINICAL DATA: Palpable liver edge on exam. Elevated liver function
tests.

EXAM:
ABDOMEN ULTRASOUND COMPLETE

[Series 1: us abdomen complete · 0.22mm/px · 13 of 103 slices shown]
[im 1/103]
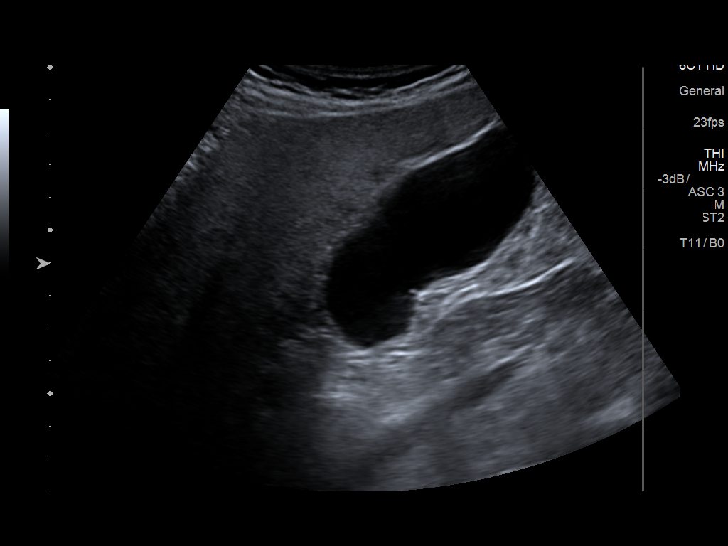
[im 9/103]
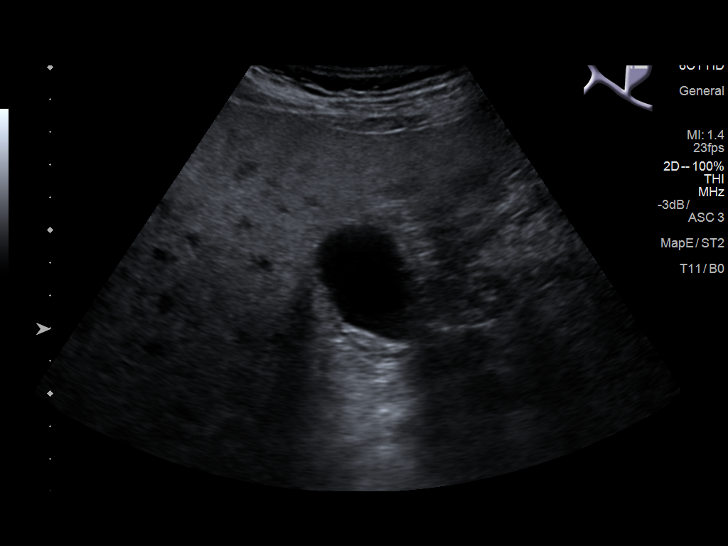
[im 18/103]
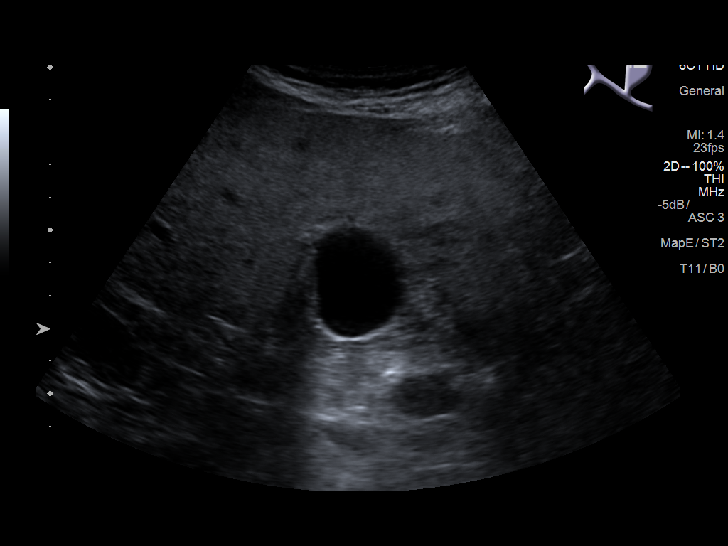
[im 26/103]
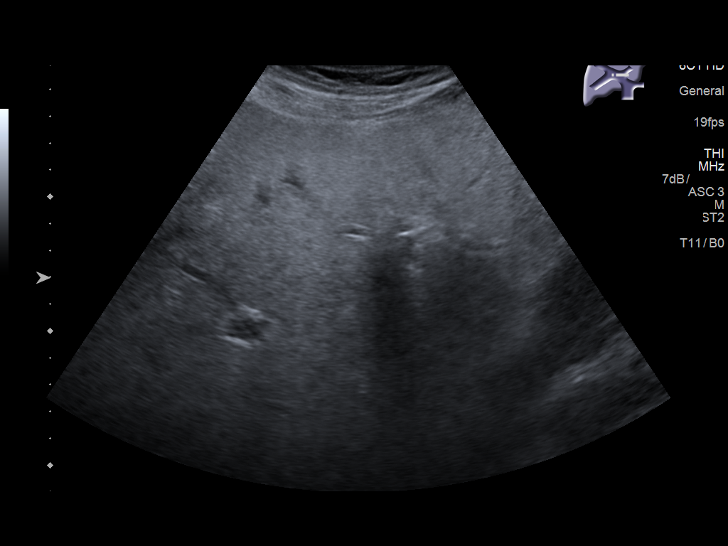
[im 35/103]
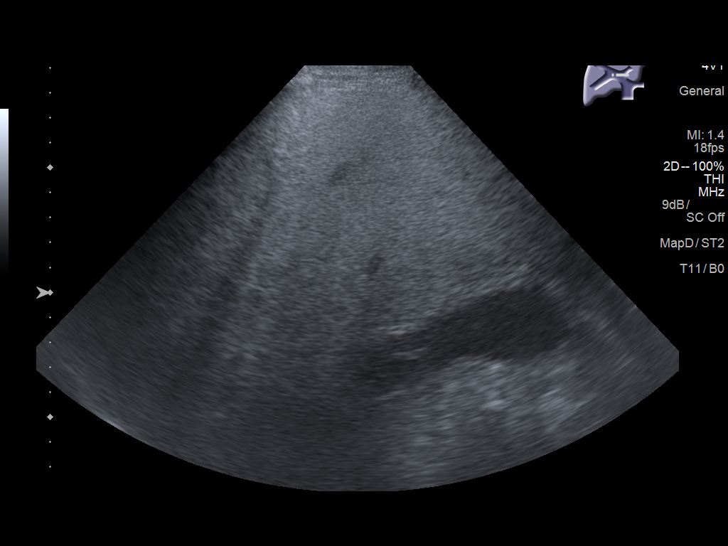
[im 43/103]
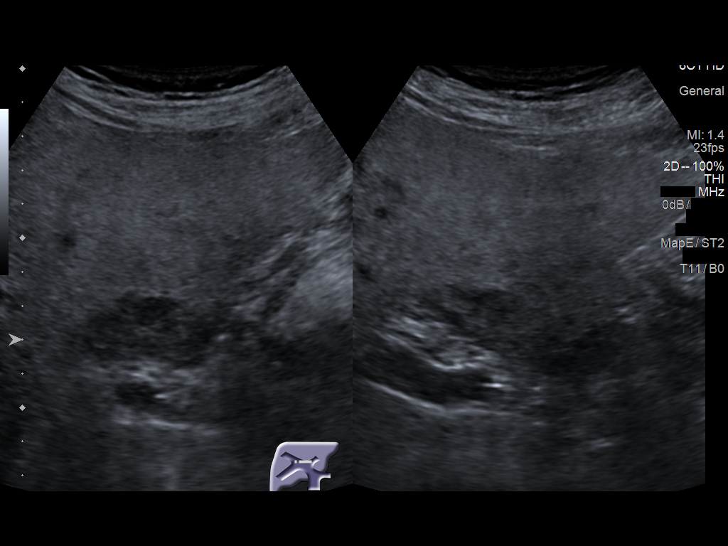
[im 52/103]
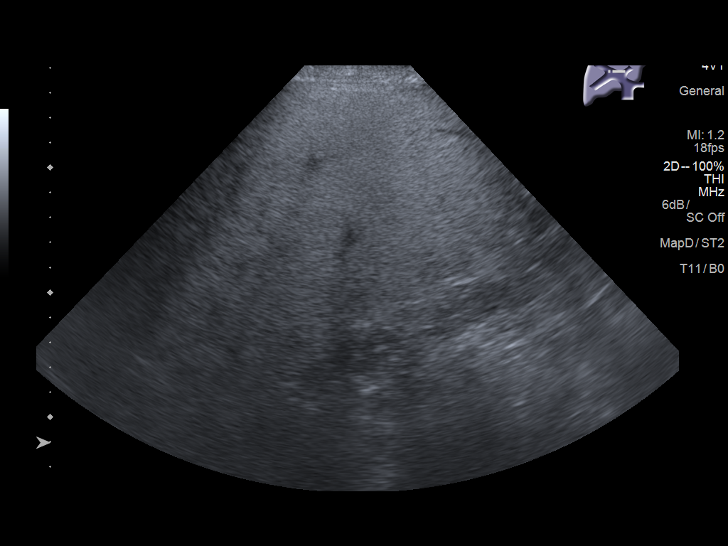
[im 60/103]
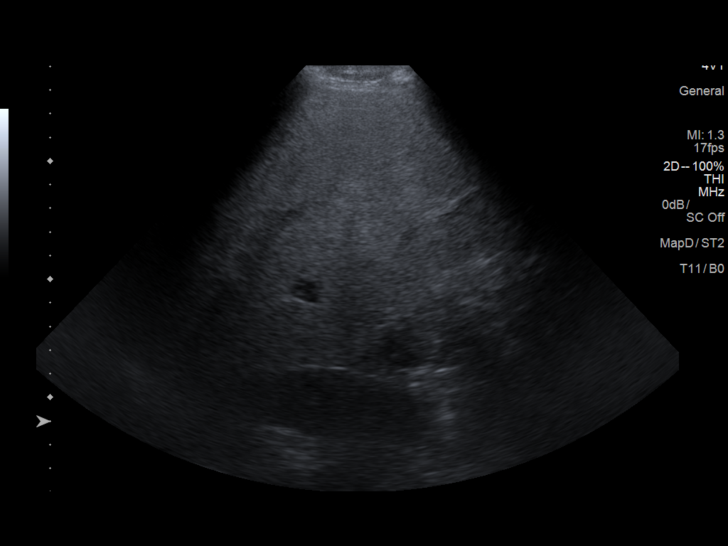
[im 69/103]
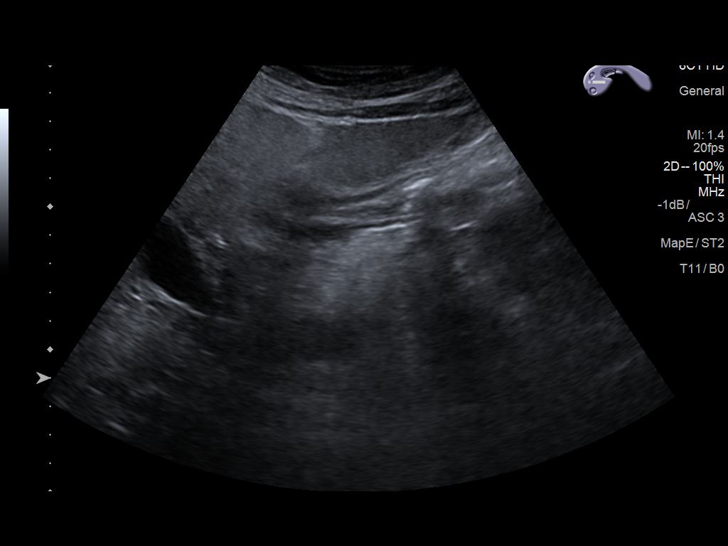
[im 77/103]
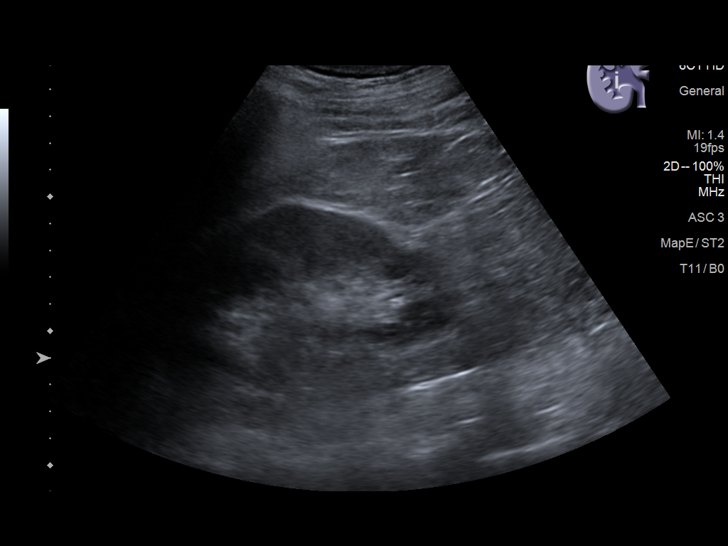
[im 86/103]
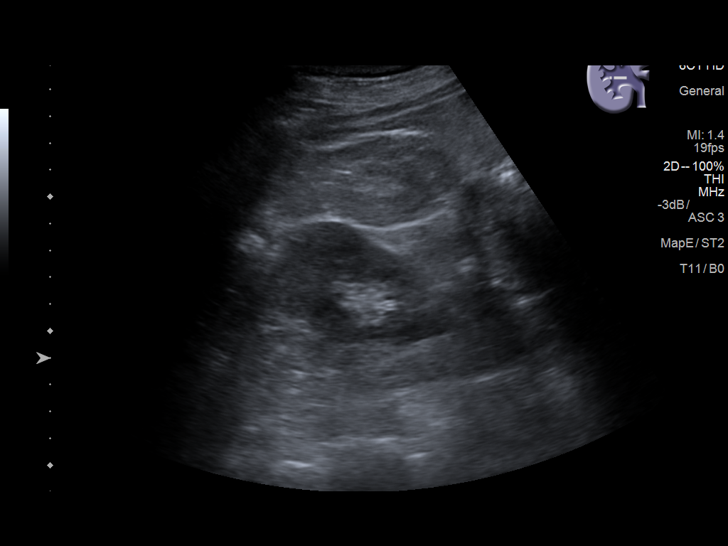
[im 94/103]
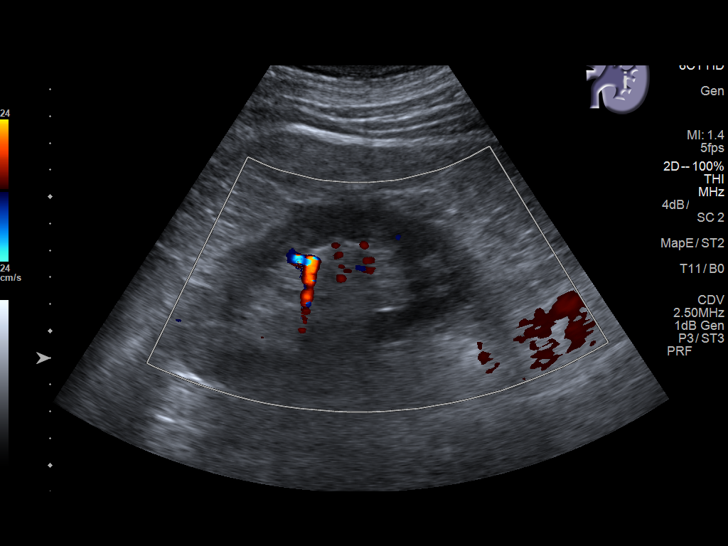
[im 103/103]
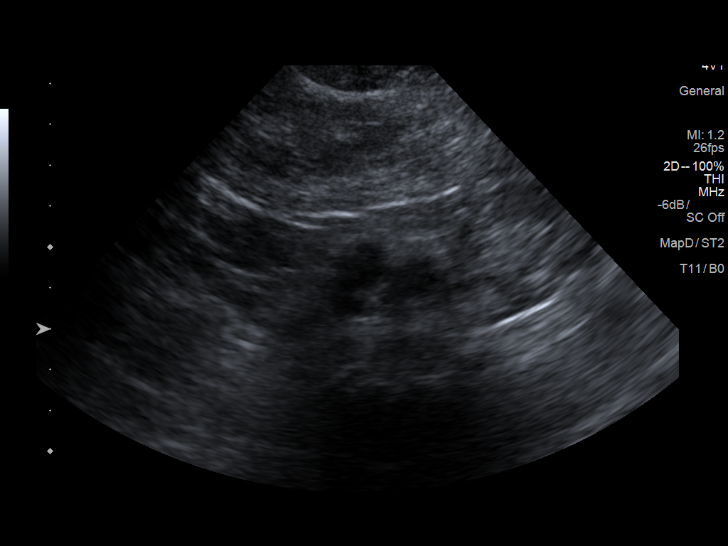

[13 of 25 positions shown; findings below may reference images not displayed]

FINDINGS: Gallbladder: No gallstones or wall thickening visualized. No
sonographic Murphy sign noted by sonographer.

Common bile duct: Diameter: 3 mm. Where visualized, no filling
defect.

Liver: Diffusely echogenic liver. There is fatty sparing around the
gallbladder fossa. Hypoechoic area in the central liver along the
porta hepatis which was measured at 5 x 2 x 5 cm. This has a
somewhat geographic shape and is in an area of of typical sparing.

IVC: No abnormality visualized.

Pancreas: Visualized portion unremarkable.

Spleen: Size and appearance within normal limits.

Right Kidney: Length: 12 cm. Echogenicity within normal limits. No
mass or hydronephrosis visualized.

Left Kidney: Length: 12 cm. 5 mm echogenic area in the interpolar
medulla with twinkle artifact and shadowing. Cortical echogenicity
within normal limits. No mass or hydronephrosis visualized.

Abdominal aorta: No aneurysm visualized.

Images and findings reviewed with Dr. Erick W.
IMPRESSION: 1. Hepatic steatosis.
2. 5 x 2 x 5 cm hypoechoic area along the porta hepatis has features
of fatty sparing. Would obtain follow-up right upper quadrant
ultrasound in 4-6 months to confirm expected stability. If needed on
a clinical basis, MRI could definitively characterize.
3. Left nephrolithiasis, 
5mm.
4. History of thrombocytopenia.  Spleen size is normal.

## 2019-01-04 DIAGNOSIS — Z23 Encounter for immunization: Secondary | ICD-10-CM | POA: Diagnosis not present

## 2019-01-04 DIAGNOSIS — D485 Neoplasm of uncertain behavior of skin: Secondary | ICD-10-CM | POA: Diagnosis not present

## 2019-01-04 DIAGNOSIS — L814 Other melanin hyperpigmentation: Secondary | ICD-10-CM | POA: Diagnosis not present

## 2019-01-04 DIAGNOSIS — Z85828 Personal history of other malignant neoplasm of skin: Secondary | ICD-10-CM | POA: Diagnosis not present

## 2019-01-04 DIAGNOSIS — Z85820 Personal history of malignant melanoma of skin: Secondary | ICD-10-CM | POA: Diagnosis not present

## 2019-01-04 DIAGNOSIS — Z808 Family history of malignant neoplasm of other organs or systems: Secondary | ICD-10-CM | POA: Diagnosis not present

## 2019-01-04 DIAGNOSIS — D225 Melanocytic nevi of trunk: Secondary | ICD-10-CM | POA: Diagnosis not present

## 2019-01-31 ENCOUNTER — Ambulatory Visit: Payer: BLUE CROSS/BLUE SHIELD | Admitting: Oncology

## 2019-02-07 DIAGNOSIS — D485 Neoplasm of uncertain behavior of skin: Secondary | ICD-10-CM | POA: Diagnosis not present

## 2019-02-07 DIAGNOSIS — D225 Melanocytic nevi of trunk: Secondary | ICD-10-CM | POA: Diagnosis not present

## 2019-02-24 DIAGNOSIS — H5213 Myopia, bilateral: Secondary | ICD-10-CM | POA: Diagnosis not present

## 2019-02-24 DIAGNOSIS — H52203 Unspecified astigmatism, bilateral: Secondary | ICD-10-CM | POA: Diagnosis not present

## 2019-02-24 DIAGNOSIS — H2513 Age-related nuclear cataract, bilateral: Secondary | ICD-10-CM | POA: Diagnosis not present

## 2019-02-24 DIAGNOSIS — E119 Type 2 diabetes mellitus without complications: Secondary | ICD-10-CM | POA: Diagnosis not present

## 2019-02-24 DIAGNOSIS — H524 Presbyopia: Secondary | ICD-10-CM | POA: Diagnosis not present

## 2019-02-28 ENCOUNTER — Encounter: Payer: Self-pay | Admitting: Oncology

## 2019-02-28 ENCOUNTER — Encounter: Payer: Self-pay | Admitting: *Deleted

## 2019-02-28 ENCOUNTER — Other Ambulatory Visit: Payer: Self-pay | Admitting: Oncology

## 2019-02-28 ENCOUNTER — Ambulatory Visit (INDEPENDENT_AMBULATORY_CARE_PROVIDER_SITE_OTHER): Payer: BLUE CROSS/BLUE SHIELD | Admitting: Oncology

## 2019-02-28 ENCOUNTER — Other Ambulatory Visit: Payer: Self-pay

## 2019-02-28 DIAGNOSIS — Z7984 Long term (current) use of oral hypoglycemic drugs: Secondary | ICD-10-CM | POA: Diagnosis not present

## 2019-02-28 DIAGNOSIS — D471 Chronic myeloproliferative disease: Secondary | ICD-10-CM | POA: Diagnosis not present

## 2019-02-28 DIAGNOSIS — M109 Gout, unspecified: Secondary | ICD-10-CM

## 2019-02-28 DIAGNOSIS — E119 Type 2 diabetes mellitus without complications: Secondary | ICD-10-CM

## 2019-02-28 DIAGNOSIS — Z888 Allergy status to other drugs, medicaments and biological substances status: Secondary | ICD-10-CM

## 2019-02-28 DIAGNOSIS — Z86006 Personal history of melanoma in-situ: Secondary | ICD-10-CM

## 2019-02-28 DIAGNOSIS — C439 Malignant melanoma of skin, unspecified: Secondary | ICD-10-CM

## 2019-02-28 DIAGNOSIS — D473 Essential (hemorrhagic) thrombocythemia: Secondary | ICD-10-CM

## 2019-02-28 DIAGNOSIS — Z85828 Personal history of other malignant neoplasm of skin: Secondary | ICD-10-CM

## 2019-02-28 DIAGNOSIS — Z7982 Long term (current) use of aspirin: Secondary | ICD-10-CM

## 2019-02-28 DIAGNOSIS — Z79899 Other long term (current) drug therapy: Secondary | ICD-10-CM

## 2019-02-28 LAB — COMPREHENSIVE METABOLIC PANEL
ALT: 38 U/L (ref 0–44)
AST: 25 U/L (ref 15–41)
Albumin: 4 g/dL (ref 3.5–5.0)
Alkaline Phosphatase: 76 U/L (ref 38–126)
Anion gap: 10 (ref 5–15)
BUN: 16 mg/dL (ref 8–23)
CO2: 24 mmol/L (ref 22–32)
CREATININE: 1.29 mg/dL — AB (ref 0.61–1.24)
Calcium: 9.5 mg/dL (ref 8.9–10.3)
Chloride: 105 mmol/L (ref 98–111)
GFR calc Af Amer: >60 mL/min (ref >60)
GFR calc non Af Amer: 59 mL/min — ABNORMAL LOW (ref >60)
Glucose, Bld: 169 mg/dL — ABNORMAL HIGH (ref 70–99)
Potassium: 4.3 mmol/L (ref 3.5–5.1)
Sodium: 139 mmol/L (ref 135–145)
Total Bilirubin: 0.8 mg/dL (ref 0.3–1.2)
Total Protein: 7.6 g/dL (ref 6.5–8.1)

## 2019-02-28 LAB — CBC WITH DIFFERENTIAL/PLATELET
Abs Immature Granulocytes: 0.05 10*3/uL (ref 0.00–0.07)
BASOS PCT: 0 %
Basophils Absolute: 0.1 10*3/uL (ref 0.0–0.1)
Eosinophils Absolute: 0.1 10*3/uL (ref 0.0–0.5)
Eosinophils Relative: 1 %
HCT: 41.6 % (ref 39.0–52.0)
Hemoglobin: 14.3 g/dL (ref 13.0–17.0)
Immature Granulocytes: 0 %
Lymphocytes Relative: 23 %
Lymphs Abs: 2.9 10*3/uL (ref 0.7–4.0)
MCH: 33.1 pg (ref 26.0–34.0)
MCHC: 34.4 g/dL (ref 30.0–36.0)
MCV: 96.3 fL (ref 80.0–100.0)
MONOS PCT: 8 %
Monocytes Absolute: 1 10*3/uL (ref 0.1–1.0)
Neutro Abs: 8.6 10*3/uL — ABNORMAL HIGH (ref 1.7–7.7)
Neutrophils Relative %: 68 %
Platelets: 366 10*3/uL (ref 150–400)
RBC: 4.32 MIL/uL (ref 4.22–5.81)
RDW: 11.9 % (ref 11.5–15.5)
WBC: 12.7 10*3/uL — ABNORMAL HIGH (ref 4.0–10.5)
nRBC: 0 % (ref 0.0–0.2)

## 2019-02-28 LAB — URIC ACID: Uric Acid, Serum: 4.1 mg/dL (ref 3.7–8.6)

## 2019-02-28 LAB — LACTATE DEHYDROGENASE: LDH: 123 U/L (ref 98–192)

## 2019-02-28 NOTE — Patient Instructions (Signed)
Referral made to Dr Benay Spice If you do not hear from the cancer center soon about an appointment call [phone]

## 2019-02-28 NOTE — Progress Notes (Signed)
Hematology and Oncology Follow Up Visit  KRUE PETERKA [id number] [birth year] 63 y.o. 02/28/2019 5:14 PM   Principle Diagnosis: Encounter Diagnoses  Name Primary?  . Essential thrombocythemia ([place])   . Melanoma of skin ([place])   . Diabetes mellitus treated with oral medication ([place])   . 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  This was initially felt to be secondary to an acute gout flare.  However, once gout was under control platelet count remained elevated.  Count in the range of 800,000.  I asked his primary care physician to obtain a JAK-2 gene analysis and in fact he did have a mutation in this gene.  He had no neurologic signs or symptoms.  No prior history of thrombosis. I  recommended aspirin 81 mg daily.   He has no major risk factors for vascular disease.  He is normotensive.  No hyperlipidemia.  Non-smoker.  He was found to be prediabetic about 6 years ago and is currently on metformin. Of interest is the fact that his mother who is in her 73s has been diagnosed with polycythemia vera. JAK-2 status unknown.  Currently on Hydrea.  She has also developed temporal arteritis.  A 78 year old sister has an elevated platelet count.  Further evaluation not known to the patient.  Another sister age 5 with no known blood problems. We mutually agreed to begin a trial of hydroxyurea.  Dose has been titrated and he has had a stable platelet count on a dose of 1 g alternating with 500 mg daily.  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  He is on a stable dose of Hydrea 1 g alternating with 500 mg daily.  Platelet count today 366,000. He denies any headache, change in vision, focal weakness, dysarthria, or paresthesias.  Medications: reviewed  Allergies:  Allergies  Allergen Reactions  . Allopurinol Rash    Review of Systems: See interim history Remaining ROS negative:   Physical Exam: Blood pressure (!)  125/94, pulse 98, temperature 97.7 F (36.5 C), temperature source Oral, height 6' (1.829 m), weight 210 lb 9.6 oz (95.5 kg), SpO2 100 %. Wt Readings from Last 3 Encounters:  02/28/19 210 lb 9.6 oz (95.5 kg)  05/11/17 220 lb 11.2 oz (100.1 kg)     General appearance: Well-nourished Caucasian man HENNT: Pharynx no erythema, exudate, mass, or ulcer. No thyromegaly or thyroid nodules Lymph nodes: No cervical, supraclavicular, or axillary lymphadenopathy Breasts:  Lungs: Clear to auscultation, resonant to percussion throughout Heart: Regular rhythm, no murmur, no gallop, no rub, no click, no edema Abdomen: Soft, nontender, normal bowel sounds, no mass, no organomegaly Extremities: No edema, no calf tenderness Musculoskeletal: no joint deformities GU:  Vascular: Carotid pulses 2+, no bruits,  symmetric Neurologic: Alert, oriented, PERRLA, optic discs sharp and vessels normal, no hemorrhage or exudate, cranial nerves grossly normal, motor strength 5 over 5, reflexes 1+ symmetric, upper body coordination normal, gait normal, Skin: No rash or ecchymosis  Lab Results: CBC W/Diff    Component Value Date/Time   WBC 12.7 (H) 02/28/2019 1510   RBC 4.32 02/28/2019 1510   HGB 14.3 02/28/2019 1510   HGB 13.7 05/11/2017 1644   HCT 41.6 02/28/2019 1510   HCT 40.7 05/11/2017 1644   PLT 366 02/28/2019 1510   PLT 826 (HH) 05/11/2017 1644   MCV 96.3 02/28/2019 1510   MCV 88 05/11/2017 1644   MCH 33.1 02/28/2019 1510   MCHC 34.4 02/28/2019  1510   RDW 11.9 02/28/2019 1510   RDW 15.4 05/11/2017 1644   LYMPHSABS 2.9 02/28/2019 1510   LYMPHSABS 2.9 05/11/2017 1644   MONOABS 1.0 02/28/2019 1510   EOSABS 0.1 02/28/2019 1510   EOSABS 0.3 05/11/2017 1644   BASOSABS 0.1 02/28/2019 1510   BASOSABS 0.0 05/11/2017 1644     Chemistry      Component Value Date/Time   NA 139 02/28/2019 1510   K 4.3 02/28/2019 1510   CL 105 02/28/2019 1510   CO2 24 02/28/2019 1510   BUN 16 02/28/2019 1510    CREATININE 1.29 (H) 02/28/2019 1510      Component Value Date/Time   CALCIUM 9.5 02/28/2019 1510   ALKPHOS 76 02/28/2019 1510   AST 25 02/28/2019 1510   ALT 38 02/28/2019 1510   BILITOT 0.8 02/28/2019 1510       Radiological Studies: No results found.  Impression:  1.  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  This is an area of research interest.  I recently heard a excellent presentation by Dr. Darden Palmer at the December 2019 [place] of hematology meeting on the subject of hereditary erythrocytosis and thrombocytosis. Plan: Continue indefinite Hydrea therapy  I am transitioning his care to Dr. Julieanne Manson at the cancer center  2.  Gout controlled on Uloric.  Intolerant to allopurinol.  3.  Type 2 diabetes on oral agent  4.  History of resected early stage melanoma and in situ melanoma  5.  History of excision of basal cell carcinomas  6.  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

## 2019-03-03 ENCOUNTER — Telehealth: Payer: Self-pay | Admitting: Oncology

## 2019-03-03 ENCOUNTER — Encounter: Payer: Self-pay | Admitting: Oncology

## 2019-03-03 NOTE — Telephone Encounter (Signed)
New referral from Dr. Beryle Beams for thrombocythemia. A new patient appt has been scheduled for the pt to see Dr. Benay Spice on 7/13 at 2pm with labs at 130pm. Letter mailed.

## 2019-03-09 ENCOUNTER — Encounter: Payer: Self-pay | Admitting: Oncology

## 2019-03-09 ENCOUNTER — Telehealth: Payer: Self-pay | Admitting: Oncology

## 2019-03-09 NOTE — Telephone Encounter (Signed)
Rescheduled the pt's hem appt to 7/14 at 1130 to see Dr. Benay Spice with labs at [facility].

## 2019-03-31 DIAGNOSIS — E119 Type 2 diabetes mellitus without complications: Secondary | ICD-10-CM | POA: Diagnosis not present

## 2019-04-04 DIAGNOSIS — D72829 Elevated white blood cell count, unspecified: Secondary | ICD-10-CM | POA: Diagnosis not present

## 2019-04-04 DIAGNOSIS — K760 Fatty (change of) liver, not elsewhere classified: Secondary | ICD-10-CM | POA: Diagnosis not present

## 2019-04-04 DIAGNOSIS — E119 Type 2 diabetes mellitus without complications: Secondary | ICD-10-CM | POA: Diagnosis not present

## 2019-04-04 DIAGNOSIS — D473 Essential (hemorrhagic) thrombocythemia: Secondary | ICD-10-CM | POA: Diagnosis not present

## 2019-04-28 DIAGNOSIS — E119 Type 2 diabetes mellitus without complications: Secondary | ICD-10-CM | POA: Diagnosis not present

## 2019-05-31 DIAGNOSIS — E119 Type 2 diabetes mellitus without complications: Secondary | ICD-10-CM | POA: Diagnosis not present

## 2019-06-23 ENCOUNTER — Telehealth: Payer: Self-pay | Admitting: Oncology

## 2019-06-23 NOTE — Telephone Encounter (Signed)
Returned call re cancelling new patient visit. Former [place] patient to see [facility]. Per patient he will call back to reschedule due to he is unsure when he will be able to come within the next month or so.

## 2019-06-26 ENCOUNTER — Other Ambulatory Visit: Payer: BLUE CROSS/BLUE SHIELD

## 2019-06-26 ENCOUNTER — Encounter: Payer: BLUE CROSS/BLUE SHIELD | Admitting: Oncology

## 2019-06-27 ENCOUNTER — Inpatient Hospital Stay: Payer: BC Managed Care – PPO

## 2019-06-27 ENCOUNTER — Inpatient Hospital Stay: Payer: BC Managed Care – PPO | Admitting: Oncology

## 2019-06-29 DIAGNOSIS — E119 Type 2 diabetes mellitus without complications: Secondary | ICD-10-CM | POA: Diagnosis not present

## 2019-07-28 DIAGNOSIS — E119 Type 2 diabetes mellitus without complications: Secondary | ICD-10-CM | POA: Diagnosis not present

## 2019-08-15 DIAGNOSIS — E119 Type 2 diabetes mellitus without complications: Secondary | ICD-10-CM | POA: Diagnosis not present

## 2019-08-15 DIAGNOSIS — K760 Fatty (change of) liver, not elsewhere classified: Secondary | ICD-10-CM | POA: Diagnosis not present

## 2019-08-16 DIAGNOSIS — Z1389 Encounter for screening for other disorder: Secondary | ICD-10-CM | POA: Diagnosis not present

## 2019-08-16 DIAGNOSIS — K760 Fatty (change of) liver, not elsewhere classified: Secondary | ICD-10-CM | POA: Diagnosis not present

## 2019-08-16 DIAGNOSIS — E119 Type 2 diabetes mellitus without complications: Secondary | ICD-10-CM | POA: Diagnosis not present

## 2019-08-16 DIAGNOSIS — D473 Essential (hemorrhagic) thrombocythemia: Secondary | ICD-10-CM | POA: Diagnosis not present

## 2019-08-31 DIAGNOSIS — E119 Type 2 diabetes mellitus without complications: Secondary | ICD-10-CM | POA: Diagnosis not present

## 2019-08-31 DIAGNOSIS — Z20828 Contact with and (suspected) exposure to other viral communicable diseases: Secondary | ICD-10-CM | POA: Diagnosis not present

## 2019-08-31 DIAGNOSIS — Z1159 Encounter for screening for other viral diseases: Secondary | ICD-10-CM | POA: Diagnosis not present

## 2019-09-19 DIAGNOSIS — Z85828 Personal history of other malignant neoplasm of skin: Secondary | ICD-10-CM | POA: Diagnosis not present

## 2019-09-19 DIAGNOSIS — Z85820 Personal history of malignant melanoma of skin: Secondary | ICD-10-CM | POA: Diagnosis not present

## 2019-09-19 DIAGNOSIS — D224 Melanocytic nevi of scalp and neck: Secondary | ICD-10-CM | POA: Diagnosis not present

## 2019-09-19 DIAGNOSIS — L821 Other seborrheic keratosis: Secondary | ICD-10-CM | POA: Diagnosis not present

## 2019-11-29 DIAGNOSIS — C439 Malignant melanoma of skin, unspecified: Secondary | ICD-10-CM | POA: Diagnosis not present

## 2019-11-29 DIAGNOSIS — Z125 Encounter for screening for malignant neoplasm of prostate: Secondary | ICD-10-CM | POA: Diagnosis not present

## 2019-11-29 DIAGNOSIS — D473 Essential (hemorrhagic) thrombocythemia: Secondary | ICD-10-CM | POA: Diagnosis not present

## 2019-11-29 DIAGNOSIS — E1165 Type 2 diabetes mellitus with hyperglycemia: Secondary | ICD-10-CM | POA: Diagnosis not present

## 2019-11-29 DIAGNOSIS — M109 Gout, unspecified: Secondary | ICD-10-CM | POA: Diagnosis not present

## 2019-11-29 DIAGNOSIS — K760 Fatty (change of) liver, not elsewhere classified: Secondary | ICD-10-CM | POA: Diagnosis not present

## 2019-11-29 DIAGNOSIS — Z0000 Encounter for general adult medical examination without abnormal findings: Secondary | ICD-10-CM | POA: Diagnosis not present

## 2019-11-29 DIAGNOSIS — E119 Type 2 diabetes mellitus without complications: Secondary | ICD-10-CM | POA: Diagnosis not present

## 2019-12-12 DIAGNOSIS — R109 Unspecified abdominal pain: Secondary | ICD-10-CM | POA: Diagnosis not present

## 2019-12-12 DIAGNOSIS — E119 Type 2 diabetes mellitus without complications: Secondary | ICD-10-CM | POA: Diagnosis not present

## 2020-01-09 DIAGNOSIS — L814 Other melanin hyperpigmentation: Secondary | ICD-10-CM | POA: Diagnosis not present

## 2020-01-09 DIAGNOSIS — B353 Tinea pedis: Secondary | ICD-10-CM | POA: Diagnosis not present

## 2020-01-09 DIAGNOSIS — L578 Other skin changes due to chronic exposure to nonionizing radiation: Secondary | ICD-10-CM | POA: Diagnosis not present

## 2020-01-12 DIAGNOSIS — Z1212 Encounter for screening for malignant neoplasm of rectum: Secondary | ICD-10-CM | POA: Diagnosis not present

## 2020-02-26 DIAGNOSIS — Z7984 Long term (current) use of oral hypoglycemic drugs: Secondary | ICD-10-CM | POA: Diagnosis not present

## 2020-02-26 DIAGNOSIS — H5213 Myopia, bilateral: Secondary | ICD-10-CM | POA: Diagnosis not present

## 2020-02-26 DIAGNOSIS — H2513 Age-related nuclear cataract, bilateral: Secondary | ICD-10-CM | POA: Diagnosis not present

## 2020-02-26 DIAGNOSIS — H52203 Unspecified astigmatism, bilateral: Secondary | ICD-10-CM | POA: Diagnosis not present

## 2020-02-26 DIAGNOSIS — E119 Type 2 diabetes mellitus without complications: Secondary | ICD-10-CM | POA: Diagnosis not present

## 2020-02-26 DIAGNOSIS — H524 Presbyopia: Secondary | ICD-10-CM | POA: Diagnosis not present

## 2020-05-29 DIAGNOSIS — E119 Type 2 diabetes mellitus without complications: Secondary | ICD-10-CM | POA: Diagnosis not present

## 2020-06-27 DIAGNOSIS — E119 Type 2 diabetes mellitus without complications: Secondary | ICD-10-CM | POA: Diagnosis not present

## 2020-07-30 DIAGNOSIS — E119 Type 2 diabetes mellitus without complications: Secondary | ICD-10-CM | POA: Diagnosis not present

## 2020-08-29 DIAGNOSIS — E119 Type 2 diabetes mellitus without complications: Secondary | ICD-10-CM | POA: Diagnosis not present

## 2020-09-24 DIAGNOSIS — D1801 Hemangioma of skin and subcutaneous tissue: Secondary | ICD-10-CM | POA: Diagnosis not present

## 2020-09-24 DIAGNOSIS — Z85820 Personal history of malignant melanoma of skin: Secondary | ICD-10-CM | POA: Diagnosis not present

## 2020-09-24 DIAGNOSIS — L821 Other seborrheic keratosis: Secondary | ICD-10-CM | POA: Diagnosis not present

## 2020-09-24 DIAGNOSIS — Z85828 Personal history of other malignant neoplasm of skin: Secondary | ICD-10-CM | POA: Diagnosis not present

## 2020-09-27 DIAGNOSIS — E119 Type 2 diabetes mellitus without complications: Secondary | ICD-10-CM | POA: Diagnosis not present

## 2020-10-30 DIAGNOSIS — E119 Type 2 diabetes mellitus without complications: Secondary | ICD-10-CM | POA: Diagnosis not present

## 2020-11-05 DIAGNOSIS — D72829 Elevated white blood cell count, unspecified: Secondary | ICD-10-CM | POA: Diagnosis not present

## 2020-11-05 DIAGNOSIS — E1165 Type 2 diabetes mellitus with hyperglycemia: Secondary | ICD-10-CM | POA: Diagnosis not present

## 2020-12-19 DIAGNOSIS — Z0000 Encounter for general adult medical examination without abnormal findings: Secondary | ICD-10-CM | POA: Diagnosis not present

## 2020-12-19 DIAGNOSIS — E1165 Type 2 diabetes mellitus with hyperglycemia: Secondary | ICD-10-CM | POA: Diagnosis not present

## 2020-12-19 DIAGNOSIS — M109 Gout, unspecified: Secondary | ICD-10-CM | POA: Diagnosis not present

## 2020-12-19 DIAGNOSIS — Z125 Encounter for screening for malignant neoplasm of prostate: Secondary | ICD-10-CM | POA: Diagnosis not present

## 2020-12-19 DIAGNOSIS — R82998 Other abnormal findings in urine: Secondary | ICD-10-CM | POA: Diagnosis not present

## 2020-12-27 DIAGNOSIS — Z0000 Encounter for general adult medical examination without abnormal findings: Secondary | ICD-10-CM | POA: Diagnosis not present

## 2020-12-27 DIAGNOSIS — E1165 Type 2 diabetes mellitus with hyperglycemia: Secondary | ICD-10-CM | POA: Diagnosis not present

## 2021-01-27 DIAGNOSIS — E1165 Type 2 diabetes mellitus with hyperglycemia: Secondary | ICD-10-CM | POA: Diagnosis not present

## 2021-02-11 DIAGNOSIS — E1165 Type 2 diabetes mellitus with hyperglycemia: Secondary | ICD-10-CM | POA: Diagnosis not present

## 2021-02-12 DIAGNOSIS — D225 Melanocytic nevi of trunk: Secondary | ICD-10-CM | POA: Diagnosis not present

## 2021-02-12 DIAGNOSIS — D045 Carcinoma in situ of skin of trunk: Secondary | ICD-10-CM | POA: Diagnosis not present

## 2021-02-12 DIAGNOSIS — D485 Neoplasm of uncertain behavior of skin: Secondary | ICD-10-CM | POA: Diagnosis not present

## 2021-02-12 DIAGNOSIS — B353 Tinea pedis: Secondary | ICD-10-CM | POA: Diagnosis not present

## 2021-02-12 DIAGNOSIS — L578 Other skin changes due to chronic exposure to nonionizing radiation: Secondary | ICD-10-CM | POA: Diagnosis not present

## 2021-02-12 DIAGNOSIS — C44519 Basal cell carcinoma of skin of other part of trunk: Secondary | ICD-10-CM | POA: Diagnosis not present

## 2021-02-12 DIAGNOSIS — L821 Other seborrheic keratosis: Secondary | ICD-10-CM | POA: Diagnosis not present

## 2021-02-12 DIAGNOSIS — L570 Actinic keratosis: Secondary | ICD-10-CM | POA: Diagnosis not present

## 2021-02-25 DIAGNOSIS — D045 Carcinoma in situ of skin of trunk: Secondary | ICD-10-CM | POA: Diagnosis not present

## 2021-02-25 DIAGNOSIS — C44519 Basal cell carcinoma of skin of other part of trunk: Secondary | ICD-10-CM | POA: Diagnosis not present

## 2021-02-26 DIAGNOSIS — H524 Presbyopia: Secondary | ICD-10-CM | POA: Diagnosis not present

## 2021-02-26 DIAGNOSIS — E1136 Type 2 diabetes mellitus with diabetic cataract: Secondary | ICD-10-CM | POA: Diagnosis not present

## 2021-02-26 DIAGNOSIS — H1131 Conjunctival hemorrhage, right eye: Secondary | ICD-10-CM | POA: Diagnosis not present

## 2021-02-26 DIAGNOSIS — H52203 Unspecified astigmatism, bilateral: Secondary | ICD-10-CM | POA: Diagnosis not present

## 2021-02-26 DIAGNOSIS — H5213 Myopia, bilateral: Secondary | ICD-10-CM | POA: Diagnosis not present

## 2021-02-26 DIAGNOSIS — Z794 Long term (current) use of insulin: Secondary | ICD-10-CM | POA: Diagnosis not present

## 2021-02-26 DIAGNOSIS — H2513 Age-related nuclear cataract, bilateral: Secondary | ICD-10-CM | POA: Diagnosis not present

## 2021-03-05 DIAGNOSIS — E1165 Type 2 diabetes mellitus with hyperglycemia: Secondary | ICD-10-CM | POA: Diagnosis not present

## 2021-03-25 DIAGNOSIS — M2550 Pain in unspecified joint: Secondary | ICD-10-CM | POA: Diagnosis not present

## 2021-05-14 DIAGNOSIS — E1165 Type 2 diabetes mellitus with hyperglycemia: Secondary | ICD-10-CM | POA: Diagnosis not present

## 2021-06-13 DIAGNOSIS — Z1152 Encounter for screening for COVID-19: Secondary | ICD-10-CM | POA: Diagnosis not present

## 2021-06-13 DIAGNOSIS — Z20828 Contact with and (suspected) exposure to other viral communicable diseases: Secondary | ICD-10-CM | POA: Diagnosis not present

## 2021-07-16 DIAGNOSIS — E1165 Type 2 diabetes mellitus with hyperglycemia: Secondary | ICD-10-CM | POA: Diagnosis not present

## 2021-09-15 DIAGNOSIS — E1165 Type 2 diabetes mellitus with hyperglycemia: Secondary | ICD-10-CM | POA: Diagnosis not present

## 2021-09-16 DIAGNOSIS — Z85828 Personal history of other malignant neoplasm of skin: Secondary | ICD-10-CM | POA: Diagnosis not present

## 2021-09-16 DIAGNOSIS — L821 Other seborrheic keratosis: Secondary | ICD-10-CM | POA: Diagnosis not present

## 2021-09-16 DIAGNOSIS — D1801 Hemangioma of skin and subcutaneous tissue: Secondary | ICD-10-CM | POA: Diagnosis not present

## 2021-09-16 DIAGNOSIS — Z85820 Personal history of malignant melanoma of skin: Secondary | ICD-10-CM | POA: Diagnosis not present

## 2021-10-01 DIAGNOSIS — Z20822 Contact with and (suspected) exposure to covid-19: Secondary | ICD-10-CM | POA: Diagnosis not present

## 2021-12-16 DIAGNOSIS — H00022 Hordeolum internum right lower eyelid: Secondary | ICD-10-CM | POA: Diagnosis not present

## 2021-12-24 DIAGNOSIS — E1165 Type 2 diabetes mellitus with hyperglycemia: Secondary | ICD-10-CM | POA: Diagnosis not present

## 2022-01-06 DIAGNOSIS — Z125 Encounter for screening for malignant neoplasm of prostate: Secondary | ICD-10-CM | POA: Diagnosis not present

## 2022-01-06 DIAGNOSIS — M109 Gout, unspecified: Secondary | ICD-10-CM | POA: Diagnosis not present

## 2022-01-06 DIAGNOSIS — E1165 Type 2 diabetes mellitus with hyperglycemia: Secondary | ICD-10-CM | POA: Diagnosis not present

## 2022-01-13 DIAGNOSIS — E1165 Type 2 diabetes mellitus with hyperglycemia: Secondary | ICD-10-CM | POA: Diagnosis not present

## 2022-01-13 DIAGNOSIS — Z1331 Encounter for screening for depression: Secondary | ICD-10-CM | POA: Diagnosis not present

## 2022-01-13 DIAGNOSIS — Z23 Encounter for immunization: Secondary | ICD-10-CM | POA: Diagnosis not present

## 2022-01-13 DIAGNOSIS — Z0000 Encounter for general adult medical examination without abnormal findings: Secondary | ICD-10-CM | POA: Diagnosis not present

## 2022-01-13 DIAGNOSIS — R82998 Other abnormal findings in urine: Secondary | ICD-10-CM | POA: Diagnosis not present

## 2022-01-13 DIAGNOSIS — Z1339 Encounter for screening examination for other mental health and behavioral disorders: Secondary | ICD-10-CM | POA: Diagnosis not present

## 2022-01-21 DIAGNOSIS — L814 Other melanin hyperpigmentation: Secondary | ICD-10-CM | POA: Diagnosis not present

## 2022-01-21 DIAGNOSIS — D485 Neoplasm of uncertain behavior of skin: Secondary | ICD-10-CM | POA: Diagnosis not present

## 2022-01-21 DIAGNOSIS — L821 Other seborrheic keratosis: Secondary | ICD-10-CM | POA: Diagnosis not present

## 2022-01-21 DIAGNOSIS — L820 Inflamed seborrheic keratosis: Secondary | ICD-10-CM | POA: Diagnosis not present

## 2022-01-21 DIAGNOSIS — B353 Tinea pedis: Secondary | ICD-10-CM | POA: Diagnosis not present

## 2022-01-21 DIAGNOSIS — L578 Other skin changes due to chronic exposure to nonionizing radiation: Secondary | ICD-10-CM | POA: Diagnosis not present

## 2022-02-25 DIAGNOSIS — E1165 Type 2 diabetes mellitus with hyperglycemia: Secondary | ICD-10-CM | POA: Diagnosis not present

## 2022-03-02 DIAGNOSIS — H5213 Myopia, bilateral: Secondary | ICD-10-CM | POA: Diagnosis not present

## 2022-03-02 DIAGNOSIS — Z7984 Long term (current) use of oral hypoglycemic drugs: Secondary | ICD-10-CM | POA: Diagnosis not present

## 2022-03-02 DIAGNOSIS — H2513 Age-related nuclear cataract, bilateral: Secondary | ICD-10-CM | POA: Diagnosis not present

## 2022-03-02 DIAGNOSIS — E1136 Type 2 diabetes mellitus with diabetic cataract: Secondary | ICD-10-CM | POA: Diagnosis not present

## 2022-04-09 DIAGNOSIS — Z1211 Encounter for screening for malignant neoplasm of colon: Secondary | ICD-10-CM | POA: Diagnosis not present

## 2022-06-23 DIAGNOSIS — R7612 Nonspecific reaction to cell mediated immunity measurement of gamma interferon antigen response without active tuberculosis: Secondary | ICD-10-CM | POA: Diagnosis not present

## 2022-07-22 DIAGNOSIS — M2550 Pain in unspecified joint: Secondary | ICD-10-CM | POA: Diagnosis not present

## 2022-07-22 DIAGNOSIS — E1165 Type 2 diabetes mellitus with hyperglycemia: Secondary | ICD-10-CM | POA: Diagnosis not present

## 2022-07-22 DIAGNOSIS — R7989 Other specified abnormal findings of blood chemistry: Secondary | ICD-10-CM | POA: Diagnosis not present

## 2022-09-23 DIAGNOSIS — D225 Melanocytic nevi of trunk: Secondary | ICD-10-CM | POA: Diagnosis not present

## 2022-09-23 DIAGNOSIS — Z85828 Personal history of other malignant neoplasm of skin: Secondary | ICD-10-CM | POA: Diagnosis not present

## 2022-09-23 DIAGNOSIS — Z85820 Personal history of malignant melanoma of skin: Secondary | ICD-10-CM | POA: Diagnosis not present

## 2022-09-23 DIAGNOSIS — L821 Other seborrheic keratosis: Secondary | ICD-10-CM | POA: Diagnosis not present

## 2023-01-20 DIAGNOSIS — E1165 Type 2 diabetes mellitus with hyperglycemia: Secondary | ICD-10-CM | POA: Diagnosis not present

## 2023-01-20 DIAGNOSIS — D72829 Elevated white blood cell count, unspecified: Secondary | ICD-10-CM | POA: Diagnosis not present

## 2023-01-20 DIAGNOSIS — K760 Fatty (change of) liver, not elsewhere classified: Secondary | ICD-10-CM | POA: Diagnosis not present

## 2023-01-20 DIAGNOSIS — Z125 Encounter for screening for malignant neoplasm of prostate: Secondary | ICD-10-CM | POA: Diagnosis not present

## 2023-01-20 DIAGNOSIS — M109 Gout, unspecified: Secondary | ICD-10-CM | POA: Diagnosis not present

## 2023-01-26 DIAGNOSIS — E1165 Type 2 diabetes mellitus with hyperglycemia: Secondary | ICD-10-CM | POA: Diagnosis not present

## 2023-01-26 DIAGNOSIS — E119 Type 2 diabetes mellitus without complications: Secondary | ICD-10-CM | POA: Diagnosis not present

## 2023-01-26 DIAGNOSIS — Z0000 Encounter for general adult medical examination without abnormal findings: Secondary | ICD-10-CM | POA: Diagnosis not present

## 2023-01-26 DIAGNOSIS — D473 Essential (hemorrhagic) thrombocythemia: Secondary | ICD-10-CM | POA: Diagnosis not present

## 2023-01-26 DIAGNOSIS — K760 Fatty (change of) liver, not elsewhere classified: Secondary | ICD-10-CM | POA: Diagnosis not present

## 2023-01-26 DIAGNOSIS — R82998 Other abnormal findings in urine: Secondary | ICD-10-CM | POA: Diagnosis not present

## 2023-01-26 DIAGNOSIS — E785 Hyperlipidemia, unspecified: Secondary | ICD-10-CM | POA: Diagnosis not present

## 2023-01-28 DIAGNOSIS — L821 Other seborrheic keratosis: Secondary | ICD-10-CM | POA: Diagnosis not present

## 2023-01-28 DIAGNOSIS — L570 Actinic keratosis: Secondary | ICD-10-CM | POA: Diagnosis not present

## 2023-01-28 DIAGNOSIS — B353 Tinea pedis: Secondary | ICD-10-CM | POA: Diagnosis not present

## 2023-01-28 DIAGNOSIS — D225 Melanocytic nevi of trunk: Secondary | ICD-10-CM | POA: Diagnosis not present

## 2023-01-28 DIAGNOSIS — L578 Other skin changes due to chronic exposure to nonionizing radiation: Secondary | ICD-10-CM | POA: Diagnosis not present

## 2023-03-02 DIAGNOSIS — K080 Exfoliation of teeth due to systemic causes: Secondary | ICD-10-CM | POA: Diagnosis not present

## 2023-03-04 DIAGNOSIS — H52203 Unspecified astigmatism, bilateral: Secondary | ICD-10-CM | POA: Diagnosis not present

## 2023-03-04 DIAGNOSIS — H524 Presbyopia: Secondary | ICD-10-CM | POA: Diagnosis not present

## 2023-03-04 DIAGNOSIS — H2513 Age-related nuclear cataract, bilateral: Secondary | ICD-10-CM | POA: Diagnosis not present

## 2023-03-04 DIAGNOSIS — E119 Type 2 diabetes mellitus without complications: Secondary | ICD-10-CM | POA: Diagnosis not present

## 2023-03-04 DIAGNOSIS — H5213 Myopia, bilateral: Secondary | ICD-10-CM | POA: Diagnosis not present

## 2023-06-01 DIAGNOSIS — D72829 Elevated white blood cell count, unspecified: Secondary | ICD-10-CM | POA: Diagnosis not present

## 2023-06-01 DIAGNOSIS — E1165 Type 2 diabetes mellitus with hyperglycemia: Secondary | ICD-10-CM | POA: Diagnosis not present

## 2023-06-01 DIAGNOSIS — E785 Hyperlipidemia, unspecified: Secondary | ICD-10-CM | POA: Diagnosis not present

## 2023-06-01 DIAGNOSIS — R7989 Other specified abnormal findings of blood chemistry: Secondary | ICD-10-CM | POA: Diagnosis not present

## 2023-06-02 DIAGNOSIS — E1165 Type 2 diabetes mellitus with hyperglycemia: Secondary | ICD-10-CM | POA: Diagnosis not present

## 2023-06-21 DIAGNOSIS — D473 Essential (hemorrhagic) thrombocythemia: Secondary | ICD-10-CM | POA: Diagnosis not present

## 2023-07-06 DIAGNOSIS — D473 Essential (hemorrhagic) thrombocythemia: Secondary | ICD-10-CM | POA: Diagnosis not present

## 2023-07-06 DIAGNOSIS — D471 Chronic myeloproliferative disease: Secondary | ICD-10-CM | POA: Diagnosis not present

## 2023-08-09 DIAGNOSIS — D473 Essential (hemorrhagic) thrombocythemia: Secondary | ICD-10-CM | POA: Diagnosis not present

## 2023-09-07 DIAGNOSIS — K080 Exfoliation of teeth due to systemic causes: Secondary | ICD-10-CM | POA: Diagnosis not present

## 2023-10-08 DIAGNOSIS — E1165 Type 2 diabetes mellitus with hyperglycemia: Secondary | ICD-10-CM | POA: Diagnosis not present

## 2023-10-11 DIAGNOSIS — D1801 Hemangioma of skin and subcutaneous tissue: Secondary | ICD-10-CM | POA: Diagnosis not present

## 2023-10-11 DIAGNOSIS — L821 Other seborrheic keratosis: Secondary | ICD-10-CM | POA: Diagnosis not present

## 2023-10-11 DIAGNOSIS — Z85828 Personal history of other malignant neoplasm of skin: Secondary | ICD-10-CM | POA: Diagnosis not present

## 2023-10-11 DIAGNOSIS — Z85820 Personal history of malignant melanoma of skin: Secondary | ICD-10-CM | POA: Diagnosis not present

## 2023-10-25 DIAGNOSIS — D473 Essential (hemorrhagic) thrombocythemia: Secondary | ICD-10-CM | POA: Diagnosis not present

## 2023-12-02 DIAGNOSIS — D473 Essential (hemorrhagic) thrombocythemia: Secondary | ICD-10-CM | POA: Diagnosis not present

## 2023-12-02 DIAGNOSIS — H524 Presbyopia: Secondary | ICD-10-CM | POA: Diagnosis not present

## 2024-01-18 DIAGNOSIS — D473 Essential (hemorrhagic) thrombocythemia: Secondary | ICD-10-CM | POA: Diagnosis not present

## 2024-01-25 DIAGNOSIS — Z1212 Encounter for screening for malignant neoplasm of rectum: Secondary | ICD-10-CM | POA: Diagnosis not present

## 2024-01-25 DIAGNOSIS — E785 Hyperlipidemia, unspecified: Secondary | ICD-10-CM | POA: Diagnosis not present

## 2024-01-26 DIAGNOSIS — E559 Vitamin D deficiency, unspecified: Secondary | ICD-10-CM | POA: Diagnosis not present

## 2024-01-26 DIAGNOSIS — M109 Gout, unspecified: Secondary | ICD-10-CM | POA: Diagnosis not present

## 2024-01-26 DIAGNOSIS — E1165 Type 2 diabetes mellitus with hyperglycemia: Secondary | ICD-10-CM | POA: Diagnosis not present

## 2024-01-26 DIAGNOSIS — Z125 Encounter for screening for malignant neoplasm of prostate: Secondary | ICD-10-CM | POA: Diagnosis not present

## 2024-01-26 DIAGNOSIS — E785 Hyperlipidemia, unspecified: Secondary | ICD-10-CM | POA: Diagnosis not present

## 2024-02-01 DIAGNOSIS — C439 Malignant melanoma of skin, unspecified: Secondary | ICD-10-CM | POA: Diagnosis not present

## 2024-02-01 DIAGNOSIS — E119 Type 2 diabetes mellitus without complications: Secondary | ICD-10-CM | POA: Diagnosis not present

## 2024-02-01 DIAGNOSIS — E1165 Type 2 diabetes mellitus with hyperglycemia: Secondary | ICD-10-CM | POA: Diagnosis not present

## 2024-02-01 DIAGNOSIS — R82998 Other abnormal findings in urine: Secondary | ICD-10-CM | POA: Diagnosis not present

## 2024-02-01 DIAGNOSIS — Z0000 Encounter for general adult medical examination without abnormal findings: Secondary | ICD-10-CM | POA: Diagnosis not present

## 2024-02-02 DIAGNOSIS — L814 Other melanin hyperpigmentation: Secondary | ICD-10-CM | POA: Diagnosis not present

## 2024-02-02 DIAGNOSIS — L578 Other skin changes due to chronic exposure to nonionizing radiation: Secondary | ICD-10-CM | POA: Diagnosis not present

## 2024-02-02 DIAGNOSIS — L821 Other seborrheic keratosis: Secondary | ICD-10-CM | POA: Diagnosis not present

## 2024-02-02 DIAGNOSIS — D225 Melanocytic nevi of trunk: Secondary | ICD-10-CM | POA: Diagnosis not present

## 2024-02-07 DIAGNOSIS — D473 Essential (hemorrhagic) thrombocythemia: Secondary | ICD-10-CM | POA: Diagnosis not present

## 2024-02-22 DIAGNOSIS — D473 Essential (hemorrhagic) thrombocythemia: Secondary | ICD-10-CM | POA: Diagnosis not present

## 2024-03-07 DIAGNOSIS — K080 Exfoliation of teeth due to systemic causes: Secondary | ICD-10-CM | POA: Diagnosis not present

## 2024-04-04 DIAGNOSIS — D473 Essential (hemorrhagic) thrombocythemia: Secondary | ICD-10-CM | POA: Diagnosis not present

## 2024-04-11 DIAGNOSIS — Z7984 Long term (current) use of oral hypoglycemic drugs: Secondary | ICD-10-CM | POA: Diagnosis not present

## 2024-04-11 DIAGNOSIS — H524 Presbyopia: Secondary | ICD-10-CM | POA: Diagnosis not present

## 2024-04-11 DIAGNOSIS — H2513 Age-related nuclear cataract, bilateral: Secondary | ICD-10-CM | POA: Diagnosis not present

## 2024-04-11 DIAGNOSIS — H43393 Other vitreous opacities, bilateral: Secondary | ICD-10-CM | POA: Diagnosis not present

## 2024-04-11 DIAGNOSIS — H5213 Myopia, bilateral: Secondary | ICD-10-CM | POA: Diagnosis not present

## 2024-04-11 DIAGNOSIS — H52203 Unspecified astigmatism, bilateral: Secondary | ICD-10-CM | POA: Diagnosis not present

## 2024-04-11 DIAGNOSIS — E119 Type 2 diabetes mellitus without complications: Secondary | ICD-10-CM | POA: Diagnosis not present

## 2024-05-11 DIAGNOSIS — H524 Presbyopia: Secondary | ICD-10-CM | POA: Diagnosis not present

## 2024-05-30 DIAGNOSIS — D473 Essential (hemorrhagic) thrombocythemia: Secondary | ICD-10-CM | POA: Diagnosis not present

## 2024-06-07 DIAGNOSIS — E1165 Type 2 diabetes mellitus with hyperglycemia: Secondary | ICD-10-CM | POA: Diagnosis not present

## 2024-06-14 DIAGNOSIS — D485 Neoplasm of uncertain behavior of skin: Secondary | ICD-10-CM | POA: Diagnosis not present

## 2024-08-01 DIAGNOSIS — D473 Essential (hemorrhagic) thrombocythemia: Secondary | ICD-10-CM | POA: Diagnosis not present

## 2024-10-12 DIAGNOSIS — Z85820 Personal history of malignant melanoma of skin: Secondary | ICD-10-CM | POA: Diagnosis not present

## 2024-10-12 DIAGNOSIS — L821 Other seborrheic keratosis: Secondary | ICD-10-CM | POA: Diagnosis not present

## 2024-10-12 DIAGNOSIS — L918 Other hypertrophic disorders of the skin: Secondary | ICD-10-CM | POA: Diagnosis not present

## 2024-10-12 DIAGNOSIS — Z85828 Personal history of other malignant neoplasm of skin: Secondary | ICD-10-CM | POA: Diagnosis not present

## 2024-10-18 DIAGNOSIS — E1165 Type 2 diabetes mellitus with hyperglycemia: Secondary | ICD-10-CM | POA: Diagnosis not present

## 2024-10-26 DIAGNOSIS — D473 Essential (hemorrhagic) thrombocythemia: Secondary | ICD-10-CM | POA: Diagnosis not present
